# Patient Record
Sex: Male | Born: 1965 | Race: White | Hispanic: No | Marital: Married
Health system: Midwestern US, Community
[De-identification: ages and names within clinical notes are randomized; demographics above are authoritative.]

## PROBLEM LIST (undated history)

## (undated) DIAGNOSIS — M79671 Pain in right foot: Secondary | ICD-10-CM

## (undated) DIAGNOSIS — Z8616 Personal history of COVID-19: Secondary | ICD-10-CM

## (undated) DIAGNOSIS — K219 Gastro-esophageal reflux disease without esophagitis: Secondary | ICD-10-CM

## (undated) DIAGNOSIS — J300 Vasomotor rhinitis: Secondary | ICD-10-CM

## (undated) DIAGNOSIS — K589 Irritable bowel syndrome without diarrhea: Secondary | ICD-10-CM

## (undated) DIAGNOSIS — E782 Mixed hyperlipidemia: Secondary | ICD-10-CM

## (undated) DIAGNOSIS — M4310 Spondylolisthesis, site unspecified: Secondary | ICD-10-CM

## (undated) DIAGNOSIS — J45998 Other asthma: Secondary | ICD-10-CM

## (undated) DIAGNOSIS — M25521 Pain in right elbow: Secondary | ICD-10-CM

## (undated) DIAGNOSIS — M25522 Pain in left elbow: Secondary | ICD-10-CM

## (undated) DIAGNOSIS — H903 Sensorineural hearing loss, bilateral: Secondary | ICD-10-CM

## (undated) HISTORY — DX: Other asthma: J45.998

## (undated) HISTORY — DX: Pain in left elbow: M25.522

## (undated) HISTORY — DX: Gastro-esophageal reflux disease without esophagitis: K21.9

## (undated) HISTORY — DX: Pain in right foot: M79.671

## (undated) HISTORY — DX: Irritable bowel syndrome without diarrhea: K58.9

## (undated) HISTORY — DX: Personal history of COVID-19: Z86.16

## (undated) HISTORY — DX: Spondylolisthesis, site unspecified: M43.10

## (undated) HISTORY — DX: Sensorineural hearing loss, bilateral: H90.3

## (undated) HISTORY — DX: Mixed hyperlipidemia: E78.2

## (undated) HISTORY — DX: Vasomotor rhinitis: J30.0

## (undated) HISTORY — DX: Pain in right elbow: M25.521

---

## 2008-09-06 ENCOUNTER — Emergency Department (HOSPITAL_COMMUNITY): Admission: EM | Admit: 2008-09-06 | Discharge: 2008-09-07 | Payer: Self-pay | Admitting: Emergency Medicine

## 2008-11-08 ENCOUNTER — Emergency Department (HOSPITAL_COMMUNITY): Admission: EM | Admit: 2008-11-08 | Discharge: 2008-11-08 | Payer: Self-pay | Admitting: Emergency Medicine

## 2008-12-20 HISTORY — PX: ESOPHAGOGASTRODUODENOSCOPY: SHX1529

## 2009-11-15 ENCOUNTER — Ambulatory Visit: Payer: Self-pay | Admitting: Professional

## 2009-11-23 ENCOUNTER — Ambulatory Visit: Payer: Self-pay | Admitting: Professional

## 2012-03-21 ENCOUNTER — Other Ambulatory Visit: Payer: Self-pay | Admitting: Gastroenterology

## 2012-03-21 DIAGNOSIS — R1013 Epigastric pain: Secondary | ICD-10-CM

## 2012-03-22 HISTORY — PX: OTHER SURGICAL HISTORY: SHX169

## 2012-04-10 ENCOUNTER — Ambulatory Visit: Payer: Self-pay

## 2012-04-12 ENCOUNTER — Ambulatory Visit
Admission: RE | Admit: 2012-04-12 | Discharge: 2012-04-12 | Disposition: A | Discharge status: Home / Self Care | Payer: BC Managed Care – PPO | Source: Ambulatory Visit | Attending: Gastroenterology | Admitting: Gastroenterology

## 2012-04-12 DIAGNOSIS — R1013 Epigastric pain: Secondary | ICD-10-CM

## 2014-02-26 IMAGING — US US ABDOMEN COMPLETE
1 series · 14 of 25 positions shown · non-contrast
Comparison: None.

CLINICAL DATA: Epigastric pain for several weeks

COMPLETE ABDOMINAL ULTRASOUND

[Series 1: us abdomen complete · 0.24mm/px · 14 of 70 slices shown]
[im 1/70]
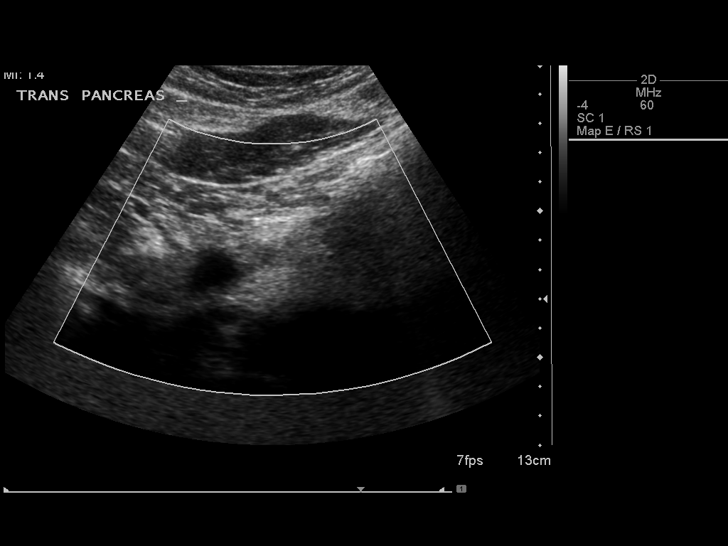
[im 6/70]
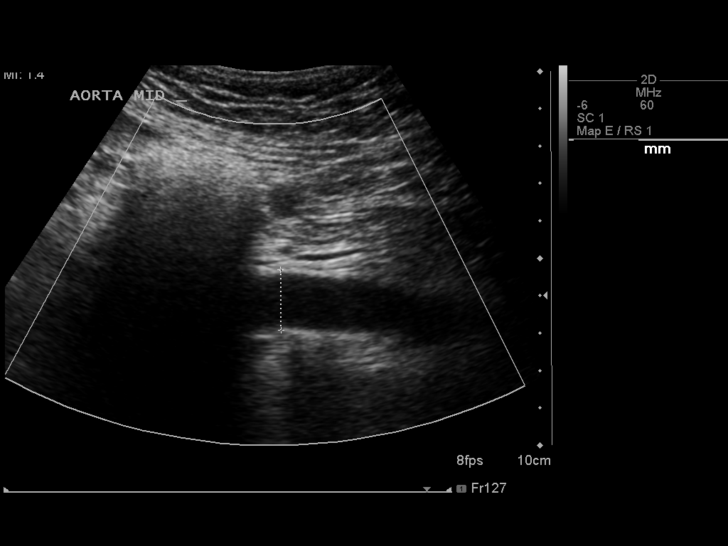
[im 12/70]
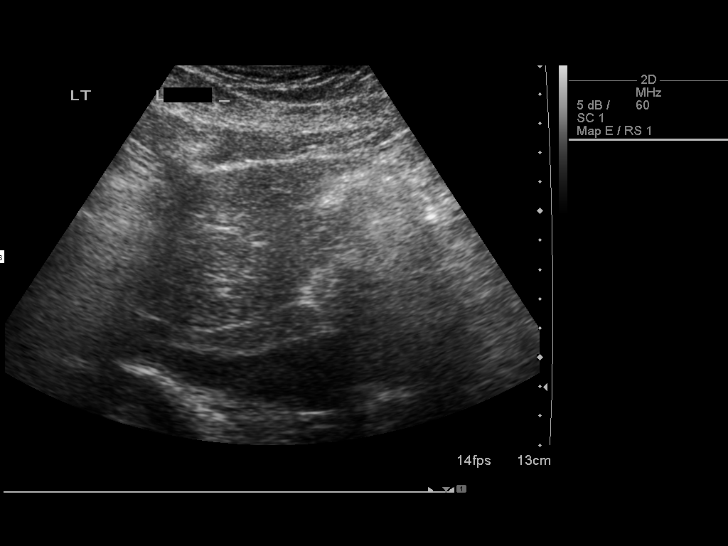
[im 18/70]
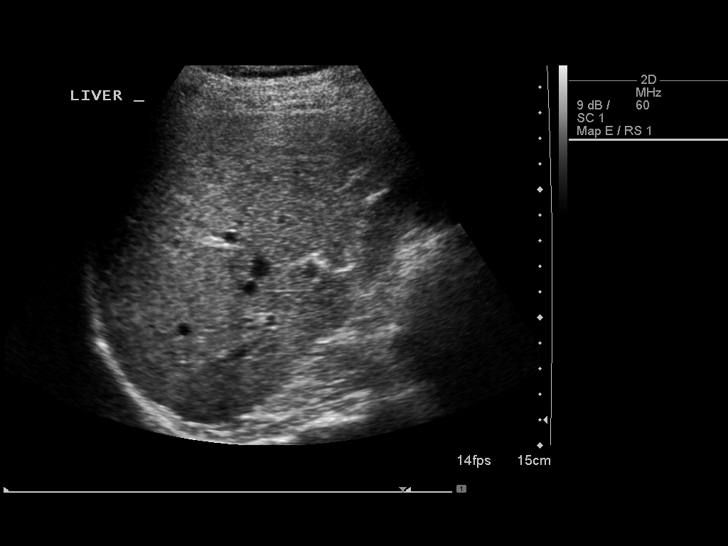
[im 24/70]
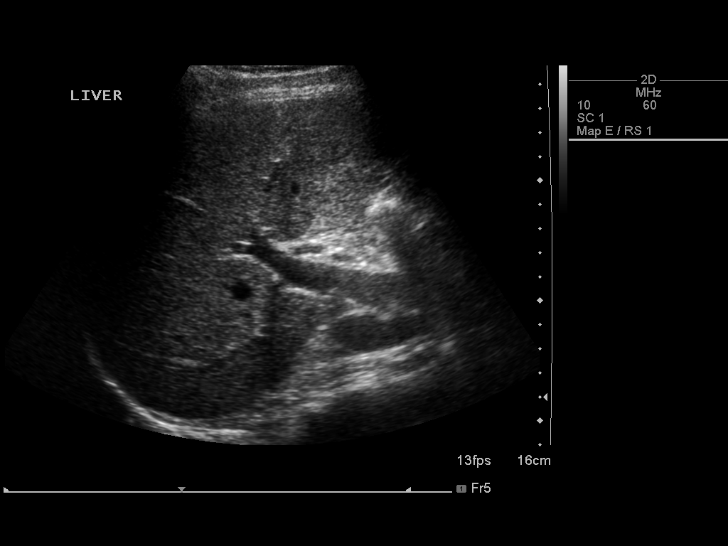
[im 26/70]
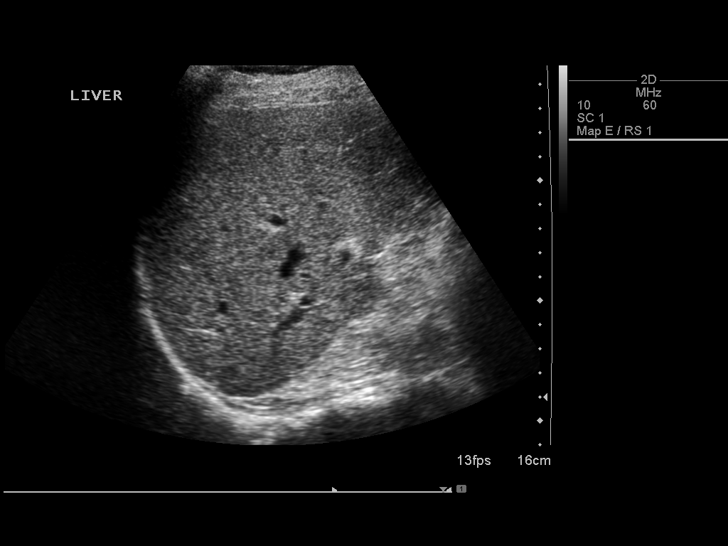
[im 32/70]
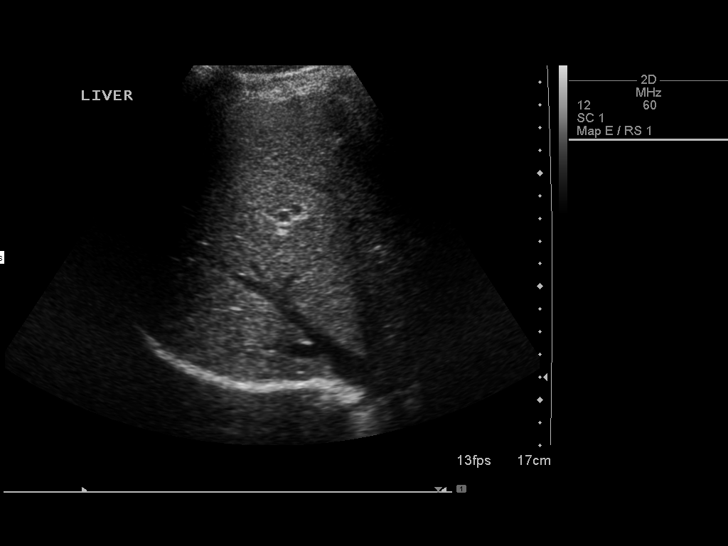
[im 38/70]
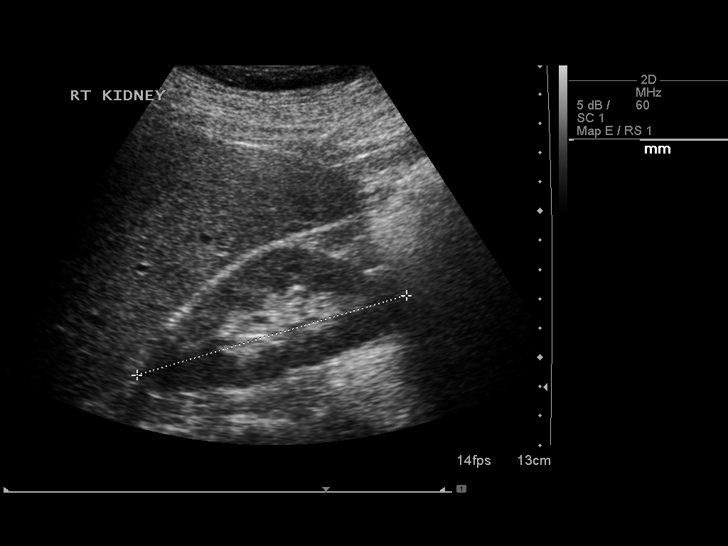
[im 44/70]
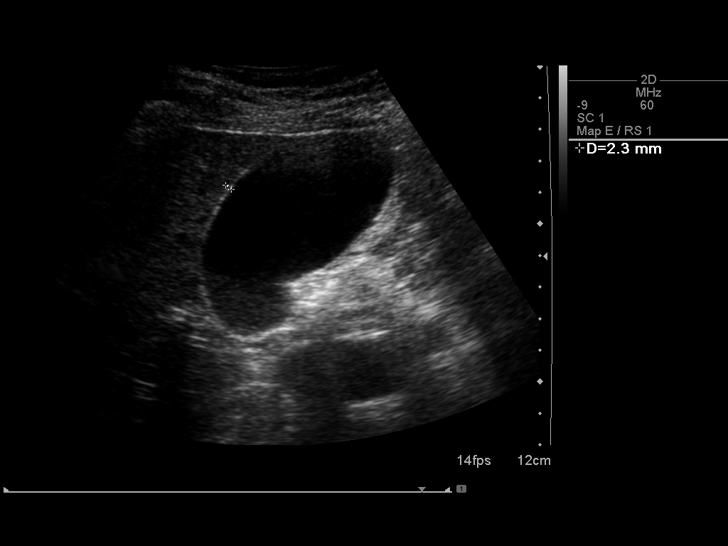
[im 47/70]
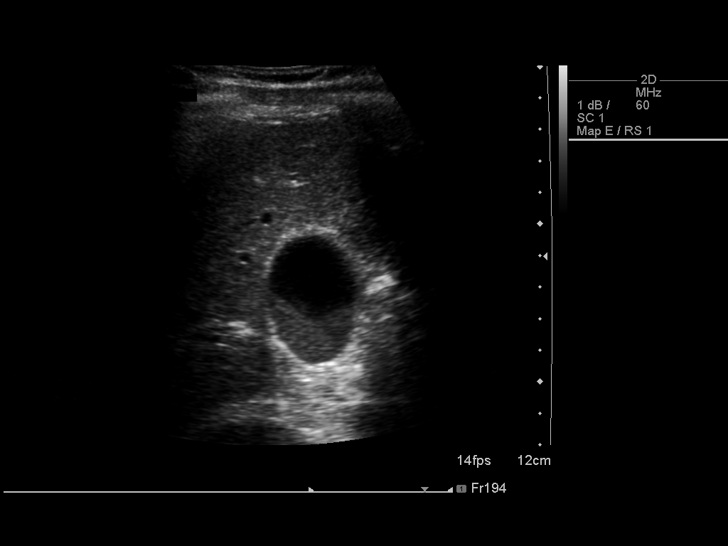
[im 52/70]
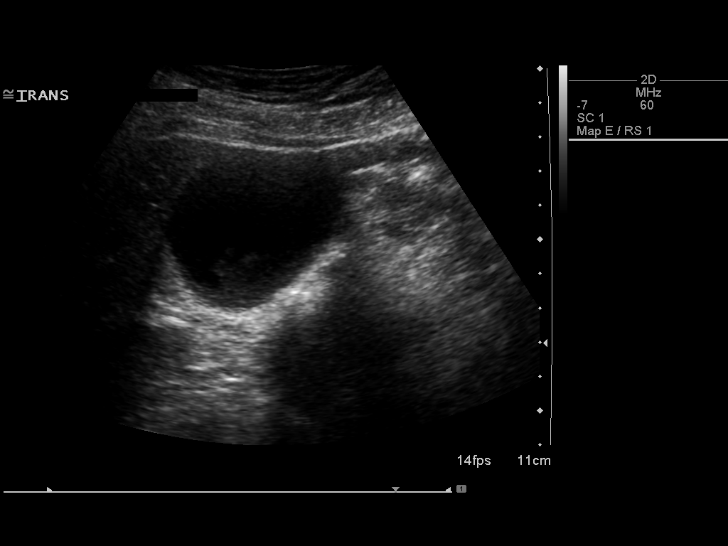
[im 58/70]
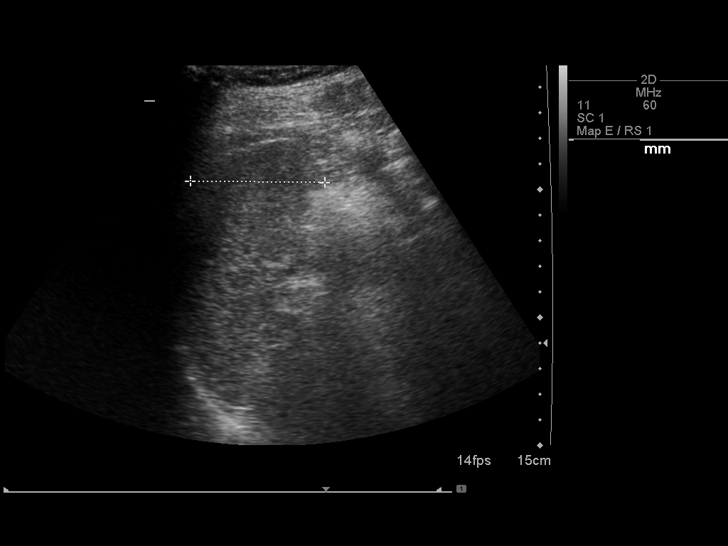
[im 64/70]
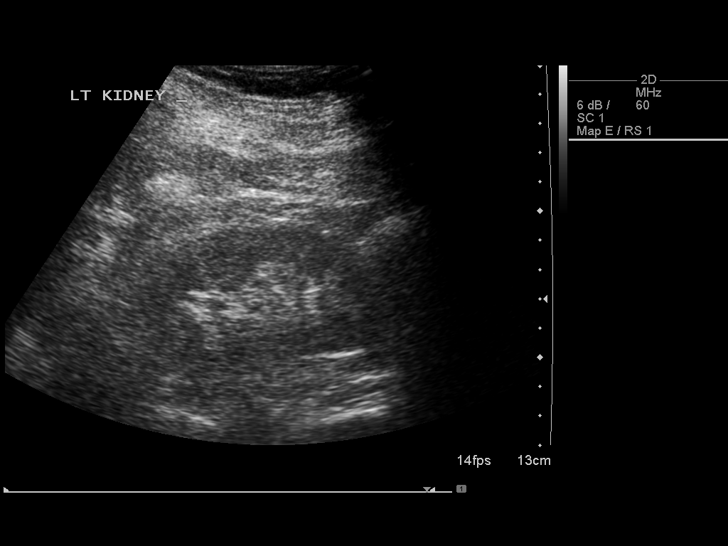
[im 70/70]
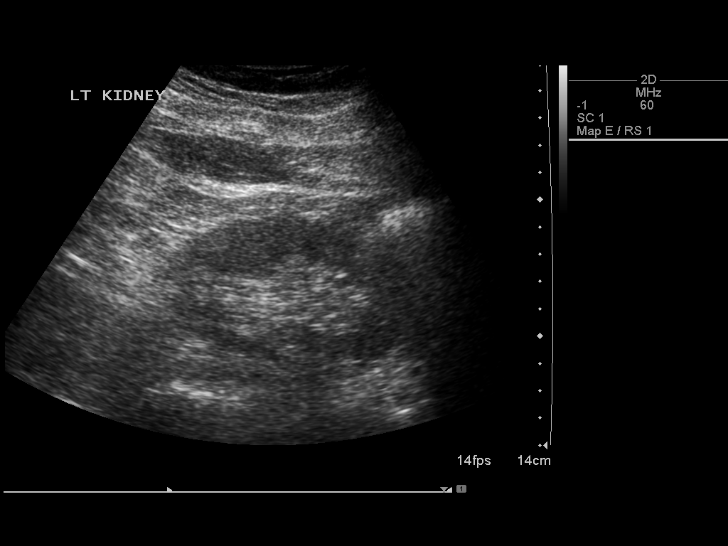

[14 of 25 positions shown; findings below may reference images not displayed]

FINDINGS: Gallbladder:  The gallbladder is well visualized and there is
minimal sludge present.  No gallstones are seen and there is no
pain over the gallbladder with compression.

Common bile duct:  The common bile duct is normal measuring 4.0 mm
in diameter.

Liver:  The liver has a normal echogenic pattern.  No ductal
dilatation is seen.

IVC:  Appears normal.

Pancreas:  The pancreas is unremarkable although the head and tail
partially obscured by bowel gas.

Spleen:  The spleen is normal measuring 5.3 cm sagittally.

Right Kidney:  No hydronephrosis is seen.  The right kidney
measures 9.6 cm sagittally.

Left Kidney:  No hydronephrosis is noted.  The left kidney measures
9.5 cm and is partially obscured by bowel gas.

Abdominal aorta:  The abdominal aorta is normal in caliber.
IMPRESSION: 1.  Gallbladder sludge.  No gallstones.  No pain over the
gallbladder.
2.  The head and tail of the pancreas are obscured by bowel gas.
3.  No hydronephrosis.

## 2014-05-22 DIAGNOSIS — K589 Irritable bowel syndrome without diarrhea: Secondary | ICD-10-CM

## 2014-05-22 HISTORY — DX: Irritable bowel syndrome, unspecified: K58.9

## 2014-07-17 ENCOUNTER — Ambulatory Visit (INDEPENDENT_AMBULATORY_CARE_PROVIDER_SITE_OTHER): Payer: BLUE CROSS/BLUE SHIELD | Admitting: Family Medicine

## 2014-07-17 ENCOUNTER — Encounter: Payer: Self-pay | Admitting: Family Medicine

## 2014-07-17 VITALS — BP 102/80 | HR 70 | Temp 98.2°F | Ht 69.0 in | Wt 169.0 lb

## 2014-07-17 DIAGNOSIS — R109 Unspecified abdominal pain: Secondary | ICD-10-CM

## 2014-07-17 DIAGNOSIS — K589 Irritable bowel syndrome without diarrhea: Secondary | ICD-10-CM

## 2014-07-17 MED ORDER — HYOSCYAMINE SULFATE 0.125 MG SL SUBL: SUBLINGUAL_TABLET | SUBLINGUAL | Status: DC

## 2014-07-17 NOTE — Progress Notes (Signed)
Office Note 07/17/2014  CC:  Chief Complaint  Patient presents with  . Establish Care    HPI:  Brian Osborne is a 49 y.o. White male who is here to establish care and discuss stomach issues. Patient's most recent primary MD: [facility]. Old records in EPIC/HL were reviewed prior to or during today's visit.  Hx of GERD and was on dexilant in the past but not for a few years. Endoscopy in the past: was told all normal.  Abd u/s has been done in past as well and he was told this was normal.  He adjusted his diet and stomach issues got better.  Describes few years hx of "unsettled" feeling in stomach intermittently, feels abd pain and urgent need to have loose stool --but 4-5 hours after eating certain meals.  This most commonly happens after very greasy food (like a hamburger), [place] food like pasta when he eats too much, reheated pizza, reheated calzone. No prob with dairy products.  The abd pain is right over the umbillicus.  Rarely vomits or has any dry heaves with this.  Metamucil has been tried and he feels like this helped. NO black or tarry stool or hematochezia.  Other times he has no pain but tendency towards frequent loose stools.   Happens once every 2 wks or so.   Denies any recent prob with actual substernal burning or typical reflux.     Past Medical History  Diagnosis Date  . GERD (gastroesophageal reflux disease)   . Vasomotor rhinitis     History reviewed. No pertinent past surgical history.  History reviewed. No pertinent family history.  History   Social History  . Marital Status: Divorced    Spouse Name: N/A  . Number of Children: N/A  . Years of Education: N/A   Occupational History  . Not on file.   Social History Main Topics  . Smoking status: Former Research scientist (life sciences)  . Smokeless tobacco: Never Used  . Alcohol Use: No  . Drug Use: No  . Sexual Activity: Not on file   Other Topics Concern  . Not on file   Social History Narrative    Married, 3 children all adults from, from first marriage.   Adopted, so FH unknown.   Educ: HS   Occup: Forensic scientist   No signif cigarette use, but cigars up until 2011.   No alcohol or drugs.   No exercise.    Outpatient Encounter Prescriptions as of 07/17/2014  Medication Sig  . azelastine (ASTELIN) 0.1 % nasal spray as needed.  . hyoscyamine (LEVSIN SL) 0.125 MG SL tablet 1-2 tabs po q4h prn    Allergies  Allergen Reactions  . Amoxicillin Other (See Comments)    Dizziness    ROS Review of Systems  Constitutional: Negative for fever, chills, appetite change and fatigue.  HENT: Negative for congestion, dental problem, ear pain and sore throat.   Eyes: Negative for discharge, redness and visual disturbance.  Respiratory: Negative for cough, chest tightness, shortness of breath and wheezing.   Cardiovascular: Negative for chest pain, palpitations and leg swelling.  Gastrointestinal: Negative for vomiting, blood in stool and abdominal distention.  Genitourinary: Negative for dysuria, urgency, frequency, hematuria, flank pain and difficulty urinating.  Musculoskeletal: Negative for myalgias, back pain, joint swelling, arthralgias and neck stiffness.  Skin: Negative for pallor and rash.  Neurological: Negative for dizziness, speech difficulty, weakness and headaches.  Hematological: Negative for adenopathy. Does not bruise/bleed easily.  Psychiatric/Behavioral: Negative for  confusion and sleep disturbance. The patient is not nervous/anxious.     PE; Blood pressure 102/80, pulse 70, temperature 98.2 F (36.8 C), temperature source Oral, height _0  (1.753 m), weight 169 lb (76.658 kg), SpO2 96 %. Gen: Alert, well appearing.  Patient is oriented to person, place, time, and situation. OXB:DZHG: no injection, icteris, swelling, or exudate.  EOMI, PERRLA. Mouth: lips without lesion/swelling.  Oral mucosa pink and moist. Oropharynx without erythema, exudate, or swelling.   Neck - No masses or thyromegaly or limitation in range of motion CV: RRR, no m/r/g.   LUNGS: CTA bilat, nonlabored resps, good aeration in all lung fields. ABD: soft, NT, ND, BS normal.  No hepatospenomegaly or mass.  No bruits. EXT: no clubbing, cyanosis, or edema.   Pertinent labs:  None today   ASSESSMENT AND PLAN:   New pt; obtain old PCP and GI records.  1) IBS, predominately diarrhea-type.  This is not connected to foods in a way to suggest malabsorption or anatomic issue, nor does this sound like a gastritis/gerd issue.  Trial of levsin 0.125, 1-2 tabs q4h prn. Discussed f/u options with pt and he chose to not schedule f/u o/v but will call in 1 mo or so if not improved, earlier if worse or new problems arise.  If not improved with this trial of med, would check celiac panel, CBC, ESR, CMET.  An After Visit Summary was printed and given to the patient.  Return if symptoms worsen or fail to improve.

## 2014-07-17 NOTE — Progress Notes (Signed)
Pre visit review using our clinic review tool, if applicable. No additional management support is needed unless otherwise documented below in the visit note. 

## 2014-08-02 ENCOUNTER — Encounter: Payer: Self-pay | Admitting: Family Medicine

## 2014-08-03 ENCOUNTER — Encounter: Payer: Self-pay | Admitting: Family Medicine

## 2014-08-03 ENCOUNTER — Ambulatory Visit (INDEPENDENT_AMBULATORY_CARE_PROVIDER_SITE_OTHER): Payer: BLUE CROSS/BLUE SHIELD | Admitting: Family Medicine

## 2014-08-03 VITALS — BP 119/86 | HR 72 | Temp 98.6°F | Resp 18 | Ht 69.0 in | Wt 167.0 lb

## 2014-08-03 DIAGNOSIS — K589 Irritable bowel syndrome without diarrhea: Secondary | ICD-10-CM

## 2014-08-03 LAB — CBC WITH DIFFERENTIAL/PLATELET
Basophils Absolute: 0 10*3/uL (ref 0.0–0.1)
Basophils Relative: 0.5 % (ref 0.0–3.0)
Eosinophils Absolute: 0.1 10*3/uL (ref 0.0–0.7)
Eosinophils Relative: 2.1 % (ref 0.0–5.0)
HCT: 48.4 % (ref 39.0–52.0)
Hemoglobin: 16.6 g/dL (ref 13.0–17.0)
Lymphocytes Relative: 22.5 % (ref 12.0–46.0)
Lymphs Abs: 1.5 10*3/uL (ref 0.7–4.0)
MCHC: 34.3 g/dL (ref 30.0–36.0)
MCV: 88.4 fl (ref 78.0–100.0)
Monocytes Absolute: 0.4 10*3/uL (ref 0.1–1.0)
Monocytes Relative: 6.1 % (ref 3.0–12.0)
Neutro Abs: 4.5 10*3/uL (ref 1.4–7.7)
Neutrophils Relative %: 68.8 % (ref 43.0–77.0)
Platelets: 214 10*3/uL (ref 150.0–400.0)
RBC: 5.47 Mil/uL (ref 4.22–5.81)
RDW: 13.3 % (ref 11.5–15.5)
WBC: 6.6 10*3/uL (ref 4.0–10.5)

## 2014-08-03 LAB — COMPREHENSIVE METABOLIC PANEL
ALT: 14 U/L (ref 0–53)
AST: 18 U/L (ref 0–37)
Albumin: 4.3 g/dL (ref 3.5–5.2)
Alkaline Phosphatase: 70 U/L (ref 39–117)
BUN: 14 mg/dL (ref 6–23)
CALCIUM: 9.6 mg/dL (ref 8.4–10.5)
CHLORIDE: 103 meq/L (ref 96–112)
CO2: 33 mEq/L — ABNORMAL HIGH (ref 19–32)
Creatinine, Ser: 0.87 mg/dL (ref 0.40–1.50)
GFR: 98.82 mL/min (ref >60.00)
Glucose, Bld: 98 mg/dL (ref 70–99)
POTASSIUM: 4.3 meq/L (ref 3.5–5.1)
SODIUM: 138 meq/L (ref 135–145)
TOTAL PROTEIN: 7 g/dL (ref 6.0–8.3)
Total Bilirubin: 1.1 mg/dL (ref 0.2–1.2)

## 2014-08-03 LAB — SEDIMENTATION RATE: Sed Rate: 10 mm/hr (ref 0–22)

## 2014-08-03 LAB — IGA: IgA: 394 mg/dL — ABNORMAL HIGH (ref 68–378)

## 2014-08-03 NOTE — Progress Notes (Signed)
Pre visit review using our clinic review tool, if applicable. No additional management support is needed unless otherwise documented below in the visit note. 

## 2014-08-03 NOTE — Progress Notes (Signed)
OFFICE NOTE  08/03/2014  CC:  Chief Complaint  Patient presents with  . Diarrhea    last GI was Dr. Carol Ada @ [facility]   HPI: Patient is a 49 y.o. Caucasian male who is here for 2 week f/u IBS symptoms. I started him on trial of prn levsin last visit.   Has had some typical "nervous stomach" followed by loose stools on a couple of occasions since I last saw him, sometimes also related to food ingestion.  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 It sounds like it may have helped once but most recent use of the med was after already having 2 days of significant sx's (took pepto during this time).  Yesterday was most recent day of loose BMs, still with nervous stomach today but no loose BMs today.  He hasn't tried eating anything today.  No fever, no rash, no joint pains, no blood in stool.  Pertinent PMH:  Past medical, surgical, social, and family history reviewed and no changes are noted since last office visit.  MEDS:  Outpatient Prescriptions Prior to Visit  Medication Sig Dispense Refill  . azelastine (ASTELIN) 0.1 % nasal spray as needed.  11  . hyoscyamine (LEVSIN SL) 0.125 MG SL tablet 1-2 tabs po q4h prn 30 tablet 3   No facility-administered medications prior to visit.    PE: Blood pressure 119/86, pulse 72, temperature 98.6 F (37 C), temperature source Temporal, resp. rate 18, height _0  (1.753 m), weight 167 lb (75.751 kg), SpO2 95 %. Gen: Alert, well appearing.  Patient is oriented to person, place, time, and situation. ABD: soft, NT/ND, BS slightly hypoactive.  IMPRESSION AND PLAN:  IBS, pt not using levsin b/c of fear of new med.  We discussed this more today and I think he will start taking it correctly now.  Also, I'll do some basic labs (CBC, CMET, ESR, TTG IgA, IgA total) and will get him back in to see his prior GI MD, Dr. Benson Norway.  He  also requests referral to nutritionist, which I ordered today.  An After Visit Summary was printed and given to the patient.  FOLLOW UP: 1 mo recheck IBS

## 2014-08-04 LAB — TISSUE TRANSGLUTAMINASE, IGA: TISSUE TRANSGLUTAMINASE AB, IGA: 1 U/mL (ref <4)

## 2014-08-15 ENCOUNTER — Encounter: Payer: Self-pay | Admitting: Family Medicine

## 2014-08-24 ENCOUNTER — Encounter: Payer: Self-pay | Admitting: Family Medicine

## 2014-08-31 ENCOUNTER — Encounter: Payer: Self-pay | Admitting: Family Medicine

## 2014-08-31 ENCOUNTER — Ambulatory Visit (INDEPENDENT_AMBULATORY_CARE_PROVIDER_SITE_OTHER): Payer: BLUE CROSS/BLUE SHIELD | Admitting: Family Medicine

## 2014-08-31 VITALS — BP 104/70 | HR 62 | Temp 98.3°F | Ht 69.0 in | Wt 167.0 lb

## 2014-08-31 DIAGNOSIS — J310 Chronic rhinitis: Secondary | ICD-10-CM | POA: Insufficient documentation

## 2014-08-31 DIAGNOSIS — K589 Irritable bowel syndrome without diarrhea: Secondary | ICD-10-CM

## 2014-08-31 MED ORDER — HYOSCYAMINE SULFATE 0.125 MG SL SUBL: SUBLINGUAL_TABLET | SUBLINGUAL | Status: DC

## 2014-08-31 MED ORDER — AZELASTINE HCL 0.1 % NA SOLN: 2.0000 | Freq: Two times a day (BID) | NASAL | Status: DC

## 2014-08-31 NOTE — Progress Notes (Signed)
OFFICE NOTE  08/31/2014  CC:  Chief Complaint  Patient presents with  . Follow-up    4 weeks   HPI: Patient is a 49 y.o. Caucasian male who is here for 1 mo f/u IBS-diarrhea. Saw Dr. Elnoria HowardHung, his GI MD, since I last saw him. Taking levsin bid and doing very well (95% of the time).  He has started adjusting diet some already but has not seen nutritionist yet--this is also helping a lot.  Asks for RFs of his azelastine nasal spray, which is helping a lot with his night time nasal congestion due to vasomotor rhinitis vs allergic rhinitis.  Pertinent PMH:  Past medical, surgical, social, and family history reviewed and no changes are noted since last office visit.  MEDS:  Outpatient Prescriptions Prior to Visit  Medication Sig Dispense Refill  . azelastine (ASTELIN) 0.1 % nasal spray as needed.  11  . bismuth subsalicylate (PEPTO BISMOL) 262 MG chewable tablet Chew 524 mg by mouth as needed.    . hyoscyamine (LEVSIN SL) 0.125 MG SL tablet 1-2 tabs po q4h prn 30 tablet 3   No facility-administered medications prior to visit.    PE: Blood pressure 104/70, pulse 62, temperature 98.3 F (36.8 C), temperature source Oral, height 5\' 9"  (1.753 m), weight 167 lb (75.751 kg), SpO2 97 %. Gen: Alert, well appearing.  Patient is oriented to person, place, time, and situation. AFFECT: pleasant, lucid thought and speech. No further exam today  IMPRESSION AND PLAN:  1) IBS, diarrhea predominant: doing well now on levsin 1 tab bid. He has approp f/u with Dr. Elnoria HowardHung, plans on getting his first screening colonoscopy with him in the near future. Re-reviewed all blood work with pt today: normal, including celiac screening.  2) Vasomotor vs allergic rhinitis: The current medical regimen is effective;  continue present plan and medications. RF'd azelastine nasal spray today.  An After Visit Summary was printed and given to the patient.  FOLLOW UP: 1 yr for fasting CPE

## 2014-08-31 NOTE — Progress Notes (Signed)
Pre visit review using our clinic review tool, if applicable. No additional management support is needed unless otherwise documented below in the visit note. 

## 2014-09-04 ENCOUNTER — Ambulatory Visit: Payer: BLUE CROSS/BLUE SHIELD | Admitting: Dietician

## 2014-09-18 ENCOUNTER — Ambulatory Visit: Payer: BLUE CROSS/BLUE SHIELD | Admitting: Dietician

## 2014-10-09 ENCOUNTER — Ambulatory Visit: Payer: BLUE CROSS/BLUE SHIELD | Admitting: Dietician

## 2015-03-02 HISTORY — PX: COLONOSCOPY W/ POLYPECTOMY: SHX1380

## 2015-03-04 ENCOUNTER — Encounter: Payer: Self-pay | Admitting: Family Medicine

## 2015-03-06 ENCOUNTER — Encounter: Payer: Self-pay | Admitting: Family Medicine

## 2015-03-18 ENCOUNTER — Encounter: Payer: Self-pay | Admitting: Family Medicine

## 2015-07-02 ENCOUNTER — Telehealth: Payer: Self-pay | Admitting: Family Medicine

## 2015-07-02 DIAGNOSIS — H9193 Unspecified hearing loss, bilateral: Secondary | ICD-10-CM

## 2015-07-02 NOTE — Telephone Encounter (Signed)
OK, but I need to know the reason the patient wants to see the ear doctor (? Ear pain, decreased hearing?, or whatever)-thx

## 2015-07-02 NOTE — Telephone Encounter (Signed)
Please advise. Thanks.  

## 2015-07-02 NOTE — Telephone Encounter (Signed)
Patient would like a referral to an ear doctor 

## 2015-07-05 NOTE — Telephone Encounter (Signed)
Spoke to pt he stated that a year ago he was seen by an ENT in [place] and was told her has hearing loss. He is wanting to see another ENT for hearing loss. 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 Please advise. Thanks.

## 2015-07-05 NOTE — Telephone Encounter (Signed)
Referral ordered to [facility] ENT audiology.

## 2015-07-05 NOTE — Telephone Encounter (Signed)
Pt advised and voiced understanding.  Pt requested to be emailed about this referral. email is [email or web address]

## 2015-07-06 NOTE — Telephone Encounter (Signed)
Patient email sent today

## 2015-08-26 ENCOUNTER — Ambulatory Visit (INDEPENDENT_AMBULATORY_CARE_PROVIDER_SITE_OTHER): Payer: BLUE CROSS/BLUE SHIELD | Admitting: Family Medicine

## 2015-08-26 ENCOUNTER — Encounter: Payer: Self-pay | Admitting: Family Medicine

## 2015-08-26 VITALS — BP 127/89 | HR 73 | Temp 98.1°F | Resp 16 | Ht 69.0 in | Wt 178.0 lb

## 2015-08-26 DIAGNOSIS — F411 Generalized anxiety disorder: Secondary | ICD-10-CM | POA: Diagnosis not present

## 2015-08-26 DIAGNOSIS — F32A Depression, unspecified: Secondary | ICD-10-CM

## 2015-08-26 DIAGNOSIS — F329 Major depressive disorder, single episode, unspecified: Secondary | ICD-10-CM

## 2015-08-26 DIAGNOSIS — F430 Acute stress reaction: Secondary | ICD-10-CM

## 2015-08-26 NOTE — Progress Notes (Signed)
Pre visit review using our clinic review tool, if applicable. No additional management support is needed unless otherwise documented below in the visit note. 

## 2015-08-26 NOTE — Progress Notes (Signed)
OFFICE VISIT  08/26/2015   CC:  Chief Complaint  Patient presents with  . Hospitalization Follow-up   HPI:    Patient is a 50 y.o. Caucasian male who presents for hospital ER follow up from 08/24/15.  This was in [facility] [place]: I reviewed the entire record of his ED visit today.  He is a IT trainertrucker, had pulled over to take a nap.  After napping, he got out of truck and felt knee pop.  Felt nauseated, lightheaded, presyncopal.  Got back in truck and felt panicky/hyperventilated.  In the ED, an EKG was normal and so was a noncontrast head CT.  Also, a CXR was normal.  CBC normal, CMET was essentially normal as well. He may have had a vasovagal response to twisting his knee or had a panic attack..    He breaks down today and says he feels totally stressed out--has felt this way for months.  Has hx of generalized anxiety, but it is all worse lately, also feeling down/sad/anhedonia.  Work stress, uncertainty about some things there.  He is stressed out by listening to too much politics--"there is way too much hate and negativity in the world.  Having more tension HA's lately.   He is very scared that he feels this way: depressed.   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  He asks about clonazepam but then said he really doesn't want to take this med. In the past when this has happened to him: approx 3 yrs ago---he saw a therapist and started exercising more and took 3 mo out of work to "get myself together".    Past Medical History  Diagnosis Date  . GERD (gastroesophageal reflux disease)     Normal Bravo pH probe study 2010  . Vasomotor rhinitis   . IBS (irritable bowel syndrome) 2016    w/diarrhea (celiac blood work neg)    Past Surgical History  Procedure Laterality Date  . Esophagogastroduodenoscopy  12/2008    Mild duodenitis, biopsies neg. (Dr. Elnoria HowardHung)  . Abd ultrasound  03/2012    Normal  . Colonoscopy w/ polypectomy   03/02/15    benign lymphoid polyp: recall 10 yrs (Dr. Elnoria HowardHung)    Outpatient Prescriptions Prior to Visit  Medication Sig Dispense Refill  . azelastine (ASTELIN) 0.1 % nasal spray Place 2 sprays into both nostrils 2 (two) times daily. 30 mL 12  . bismuth subsalicylate (PEPTO BISMOL) 262 MG chewable tablet Chew 524 mg by mouth as needed.    . hyoscyamine (LEVSIN SL) 0.125 MG SL tablet 1-2 tabs po q4h prn (Patient not taking: Reported on 08/26/2015) 60 tablet 12   No facility-administered medications prior to visit.    Allergies  Allergen Reactions  . Amoxicillin Other (See Comments)    Dizziness    ROS As per HPI  PE: Blood pressure 127/89, pulse 73, temperature 98.1 F (36.7 C), temperature source Oral, resp. rate 16, height 5\' 9"  (1.753 m), weight 178 lb (80.74 kg), SpO2 96 %. Gen: Alert, well appearing.  Patient is oriented to person, place, time, and situation. ZOX:WRUEENT:Eyes: no injection, icteris, swelling, or exudate.  EOMI, PERRLA. Mouth: lips without lesion/swelling.  Oral mucosa pink and moist. Oropharynx without erythema, exudate, or swelling.  Neck - No masses or thyromegaly or limitation in range of motion CV: RRR, no m/r/g.   LUNGS: CTA bilat, nonlabored resps, good aeration in all lung fields. EXT: no clubbing, cyanosis,  or edema.    LABS:  None today  IMPRESSION AND PLAN:  1) Vasovagal response vs panic attack: prompted recent ED visit.  All was normal in w/u. Reassured pt again today.  2) GAD with recent acute stress reaction.  Also with depression at this time. Discussed treatment options and he is against meds at the current time.  He is agreeable to seeing a therapist so I gave him contact info for Ollen Gross, PhD.  Encouraged pt to start exercising, stop listening to politics on radio/TV. Wrote note releasing him to return to work today. Encouraged pt to get established in a church of his choice so he can get this support as well.  An After Visit Summary was  printed and given to the patient.  Spent 35 min with pt today, with >50% of this time spent in counseling and care coordination regarding the above problems.  FOLLOW UP: Return for f/u 4-6 wks anx/dep.  Signed:  Santiago Bumpers, MD           08/26/2015

## 2015-08-26 NOTE — Patient Instructions (Signed)
Ellen Wilson, PhD [phone] [id number] in [place] (office of Dr. Parrish McKinney).  

## 2015-09-28 ENCOUNTER — Other Ambulatory Visit: Payer: Self-pay

## 2015-09-28 MED ORDER — AZELASTINE HCL 0.1 % NA SOLN: 2.0000 | Freq: Two times a day (BID) | NASAL | Status: DC

## 2016-02-18 ENCOUNTER — Ambulatory Visit (INDEPENDENT_AMBULATORY_CARE_PROVIDER_SITE_OTHER): Payer: BLUE CROSS/BLUE SHIELD | Admitting: Family Medicine

## 2016-02-18 ENCOUNTER — Encounter: Payer: Self-pay | Admitting: Family Medicine

## 2016-02-18 VITALS — BP 134/92 | HR 107 | Temp 98.1°F | Resp 16 | Wt 178.4 lb

## 2016-02-18 DIAGNOSIS — J4521 Mild intermittent asthma with (acute) exacerbation: Secondary | ICD-10-CM

## 2016-02-18 DIAGNOSIS — J302 Other seasonal allergic rhinitis: Secondary | ICD-10-CM

## 2016-02-18 DIAGNOSIS — J069 Acute upper respiratory infection, unspecified: Secondary | ICD-10-CM

## 2016-02-18 MED ORDER — ALBUTEROL SULFATE HFA 108 (90 BASE) MCG/ACT IN AERS: 1.0000 | INHALATION_SPRAY | RESPIRATORY_TRACT | 1 refills | Status: DC | PRN

## 2016-02-18 MED ORDER — PREDNISONE 20 MG PO TABS: ORAL_TABLET | ORAL | 0 refills | Status: DC

## 2016-02-18 NOTE — Patient Instructions (Signed)
Get otc generic robitussin DM OR Mucinex DM and use as directed on the packaging for cough and congestion. Use otc generic saline nasal spray 2-3 times per day to irrigate/moisturize your nasal passages.   

## 2016-02-18 NOTE — Progress Notes (Signed)
OFFICE VISIT  02/18/2016   CC:  Chief Complaint  Patient presents with  . Allergic Rhinitis     seasonal allergies   HPI:    Patient is a 50 y.o.  male who presents for "seasonal allergies". For about the last 5 days has been having lots of cough and SOB.  Feels mucous rattle but not coming all the way up. Says he gets like this each year September.  Taking astelin and allegra D.   Has been using an old albuterol inhaler and it helps some.   Says chronic eye and nose sx's of allergies are stable lately. No fever.  He does hear some wheeze "kinda".  Most recent albuterol MDI use was last night.   Past Medical History:  Diagnosis Date  . GERD (gastroesophageal reflux disease)    Normal Bravo pH probe study 2010  . IBS (irritable bowel syndrome) 2016   w/diarrhea (celiac blood work neg)  . Vasomotor rhinitis     Past Surgical History:  Procedure Laterality Date  . abd ultrasound  03/2012   Normal  . COLONOSCOPY W/ POLYPECTOMY  03/02/15   benign lymphoid polyp: recall 10 yrs (Dr. Elnoria HowardHung)  . ESOPHAGOGASTRODUODENOSCOPY  12/2008   Mild duodenitis, biopsies neg. (Dr. Elnoria HowardHung)    Outpatient Medications Prior to Visit  Medication Sig Dispense Refill  . azelastine (ASTELIN) 0.1 % nasal spray Place 2 sprays into both nostrils 2 (two) times daily. 30 mL 12  . bismuth subsalicylate (PEPTO BISMOL) 262 MG chewable tablet Chew 524 mg by mouth as needed.    . naproxen sodium (ANAPROX) 220 MG tablet Take 1 tablet by mouth 2 (two) times daily as needed.     No facility-administered medications prior to visit.     Allergies  Allergen Reactions  . Amoxicillin Other (See Comments)    Dizziness    ROS As per HPI  PE: Blood pressure (!) 134/92, pulse (!) 107, temperature 98.1 F (36.7 C), temperature source Oral, resp. rate 16, weight 178 lb 6.4 oz (80.9 kg), SpO2 96 %. VS: noted--normal. Gen: alert, NAD, well- APPEARING. HEENT: eyes without injection, drainage, or swelling.  Ears:  EACs clear, TMs with normal light reflex and landmarks.  Nose: Clear rhinorrhea, with some dried, crusty exudate adherent to mildly injected mucosa.  No purulent d/c.  No paranasal sinus TTP.  No facial swelling.  Throat and mouth without focal lesion.  No pharyngial swelling, erythema, or exudate.   Neck: supple, no LAD.   LUNGS: CTA bilat on inspiration, but he has mild diffuse exp wheezing with decent aeration, nonlabored resps.  Frequent mid or post exhalation coughing is noted. CV: RRR, no m/r/g. EXT: no c/c/e SKIN: no rash  LABS:  none  IMPRESSION AND PLAN:  His allergic rhinitis actually sounds stable. He has mild intermittent asthma, acute flare currently. Prednisone 40mg  qd x 5d. Ventolin HFA 1-2 p q4h prn.  Therapeutic expectations and side effect profile of medication discussed today.  Patient's questions answered. Get otc generic robitussin DM OR Mucinex DM and use as directed on the packaging for cough and congestion. Use otc generic saline nasal spray 2-3 times per day to irrigate/moisturize your nasal passages.  An After Visit Summary was printed and given to the patient.  FOLLOW UP: Return if symptoms worsen or fail to improve.  Signed:  Santiago BumpersPhil McGowen, MD           02/18/2016

## 2016-02-18 NOTE — Progress Notes (Signed)
Pre visit review using our clinic review tool, if applicable. No additional management support is needed unless otherwise documented below in the visit note. 

## 2016-06-14 ENCOUNTER — Encounter: Payer: Self-pay | Admitting: Podiatry

## 2016-06-14 ENCOUNTER — Ambulatory Visit (INDEPENDENT_AMBULATORY_CARE_PROVIDER_SITE_OTHER): Payer: BLUE CROSS/BLUE SHIELD

## 2016-06-14 ENCOUNTER — Ambulatory Visit (INDEPENDENT_AMBULATORY_CARE_PROVIDER_SITE_OTHER): Payer: BLUE CROSS/BLUE SHIELD | Admitting: Podiatry

## 2016-06-14 VITALS — BP 139/94 | HR 67 | Resp 16 | Ht 69.0 in | Wt 172.0 lb

## 2016-06-14 DIAGNOSIS — M7751 Other enthesopathy of right foot: Secondary | ICD-10-CM | POA: Diagnosis not present

## 2016-06-14 DIAGNOSIS — M79671 Pain in right foot: Secondary | ICD-10-CM | POA: Diagnosis not present

## 2016-06-14 DIAGNOSIS — M778 Other enthesopathies, not elsewhere classified: Secondary | ICD-10-CM

## 2016-06-14 DIAGNOSIS — M779 Enthesopathy, unspecified: Secondary | ICD-10-CM

## 2016-06-14 MED ORDER — TRIAMCINOLONE ACETONIDE 10 MG/ML IJ SUSP
10.0000 mg | Freq: Once | INTRAMUSCULAR | Status: AC
  Administered 2016-06-14: 10 mg

## 2016-06-14 NOTE — Progress Notes (Signed)
Subjective:     Patient ID: Brian Osborne, male   DOB: August 07, 1965, 51 y.o.   MRN: [id number]  HPI patient presents stating I have had pain in the bottom my right foot for around a month that's improving slightly but it's still feels inflamed. Patient states that he's been walking differently due to discomfort   Review of Systems  All other systems reviewed and are negative.      Objective:   Physical Exam  Constitutional: He is oriented to person, place, and time.  Cardiovascular: Intact distal pulses.   Musculoskeletal: Normal range of motion.  Neurological: He is oriented to person, place, and time.  Skin: Skin is warm.  Nursing note and vitals reviewed.  neurovascular status intact muscle strength adequate range of motion within normal limits with patient noted to have in the right foot quite a bit of discomfort around the tibial sesamoid complex with inflammation fluid buildup. It is localized in nature and it is tender when pressed but not extreme with fluid buildup around the sesamoidal complex tibial. 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 Today careful injection administered around the sesamoidal complex and explain the possibility for fracture and that ultimately the tibial sesamoid may need to be removed. 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

## 2016-06-14 NOTE — Progress Notes (Signed)
   Subjective:    Patient ID: Brian RuffJohn W Hires, male    DOB: [birth year], 51 y.o.   MRN: [id number]  HPI  Chief Complaint  Patient presents with  . Foot Pain    Right; Plantar Forefoot; Beneath Great Toe x 1 month.        Review of Systems     Objective:   Physical Exam        Assessment & Plan:

## 2016-06-20 ENCOUNTER — Encounter: Payer: Self-pay | Admitting: Family Medicine

## 2016-09-25 ENCOUNTER — Encounter: Payer: Self-pay | Admitting: Family Medicine

## 2016-09-25 ENCOUNTER — Ambulatory Visit (INDEPENDENT_AMBULATORY_CARE_PROVIDER_SITE_OTHER): Payer: BLUE CROSS/BLUE SHIELD | Admitting: Family Medicine

## 2016-09-25 VITALS — BP 101/64 | HR 80 | Temp 98.2°F | Resp 16 | Ht 69.0 in | Wt 168.2 lb

## 2016-09-25 DIAGNOSIS — R42 Dizziness and giddiness: Secondary | ICD-10-CM | POA: Diagnosis not present

## 2016-09-25 NOTE — Progress Notes (Signed)
OFFICE VISIT  09/25/2016   CC:  Chief Complaint  Patient presents with  . Dizziness    started this morning   HPI:    Patient is a 51 y.o.  male who presents for dizziness. Describes waking up, felt lightheadedness upon getting out of bed, felt like he was going to faint.  Layed down.  Tried to get up again and felt same thing happen.  He started to get signif anxiety.  Dizziness was postural and seemed to last a minute at most.  Went back to sleep a couple of hours and has been feeling good, no recurrence of symptoms since then.   No vertigo.   No palpitations.  This kind of thing happens to him every once in a while w/out any clear cause.  Checked bp last night due to HA: 126/82. Allergic rhinitis worse lately, feels sinus/head fullness.  He restarted his flonase in the last week and feels a bit better. Says he stopped soda in the last 10d, drinking more water and gatorade.   Pt does describe a hx orthostatic dizziness.  ROS: no fevers, no cough, no n/v/d, no heat intolerance, no rash.  Past Medical History:  Diagnosis Date  . GERD (gastroesophageal reflux disease)    Normal Bravo pH probe study 2010  . IBS (irritable bowel syndrome) 2016   w/diarrhea (celiac blood work neg)  . Right foot pain 05/2016   Inflammatory sesamoiditis (Dr. Charlsie Merlesegal)  . Vasomotor rhinitis     Past Surgical History:  Procedure Laterality Date  . abd ultrasound  03/2012   Normal  . COLONOSCOPY W/ POLYPECTOMY  03/02/15   benign lymphoid polyp: recall 10 yrs (Dr. Elnoria HowardHung)  . ESOPHAGOGASTRODUODENOSCOPY  12/2008   Mild duodenitis, biopsies neg. (Dr. Elnoria HowardHung)    Outpatient Medications Prior to Visit  Medication Sig Dispense Refill  . albuterol (VENTOLIN HFA) 108 (90 Base) MCG/ACT inhaler Inhale 1-2 puffs into the lungs every 4 (four) hours as needed for wheezing or shortness of breath. 18 g 1  . azelastine (ASTELIN) 0.1 % nasal spray Place 2 sprays into both nostrils 2 (two) times daily. 30 mL 12  . bismuth  subsalicylate (PEPTO BISMOL) 262 MG chewable tablet Chew 524 mg by mouth as needed.    . naproxen sodium (ANAPROX) 220 MG tablet Take 1 tablet by mouth 2 (two) times daily as needed.     No facility-administered medications prior to visit.     Allergies  Allergen Reactions  . Amoxicillin Other (See Comments)    Dizziness    ROS As per HPI  PE: Blood pressure 101/64, pulse 80, temperature 98.2 F (36.8 C), temperature source Oral, resp. rate 16, height 5\' 9"  (1.753 m), weight 168 lb 4 oz (76.3 kg), SpO2 98 %.  Orthostatics: supine 116/81, 71   Sitting 121/80, 84     Standing 123/89, 96  (Pt had no dizziness with orthostatic checks here today). Gen: Alert, well appearing.  Patient is oriented to person, place, time, and situation. AFFECT: pleasant, lucid thought and speech. ENT: Ears: EACs clear, normal epithelium.  TMs with good light reflex and landmarks bilaterally.  Eyes: no injection, icteris, swelling, or exudate.  EOMI, PERRLA. Nose: no drainage or turbinate edema/swelling.  No injection or focal lesion.  Mouth: lips without lesion/swelling.  Oral mucosa pink and moist.  Dentition intact and without obvious caries or gingival swelling.  Oropharynx without erythema, exudate, or swelling.  Neck - No masses or thyromegaly or limitation in range of motion.  Carotids 2+ w/out bruit. CV: RRR, no m/r/g.   LUNGS: CTA bilat, nonlabored resps, good aeration in all lung fields. EXT: no clubbing, cyanosis, or edema.  Neuro: CN 2-12 intact bilaterally, strength 5/5 in proximal and distal upper extremities and lower extremities bilaterally.  No tremor.  FNF normal bilat.  No ataxia.  Upper extremity and lower extremity DTRs symmetric.  No pronator drift.   LABS:   Lab Results  Component Value Date   WBC 6.6 08/03/2014   HGB 16.6 08/03/2014   HCT 48.4 08/03/2014   MCV 88.4 08/03/2014   PLT 214.0 08/03/2014   Lab Results  Component Value Date   CREATININE 0.87 08/03/2014   BUN 14  08/03/2014   NA 138 08/03/2014   K 4.3 08/03/2014   CL 103 08/03/2014   CO2 33 (H) 08/03/2014   Lab Results  Component Value Date   ALT 14 08/03/2014   AST 18 08/03/2014   ALKPHOS 70 08/03/2014   BILITOT 1.1 08/03/2014   IMPRESSION AND PLAN:  Orthostatic dizziness: no clear etiology or contributing factors.  His HR here today did increase significantly with each change in position. He has had a tendency towards this in the past. Reiterated need to wait briefly after sitting up or standing. Importance of good hydration discussed. No red flags for CV or neurologic problem. Doubtful that his recent worsening of allergic rhinitis is contributing to his sx's.  An After Visit Summary was printed and given to the patient.  FOLLOW UP: Return if symptoms worsen or fail to improve.  Signed:  Santiago Bumpers, MD           09/25/2016

## 2016-09-25 NOTE — Progress Notes (Signed)
A user error has taken place: encounter opened in error, closed for administrative reasons.

## 2016-09-25 NOTE — Progress Notes (Signed)
Pre visit review using our clinic review tool, if applicable. No additional management support is needed unless otherwise documented below in the visit note. 

## 2016-10-04 ENCOUNTER — Ambulatory Visit (INDEPENDENT_AMBULATORY_CARE_PROVIDER_SITE_OTHER): Payer: BLUE CROSS/BLUE SHIELD | Admitting: Family Medicine

## 2016-10-04 ENCOUNTER — Encounter: Payer: Self-pay | Admitting: Family Medicine

## 2016-10-04 VITALS — BP 126/78 | HR 72 | Temp 98.0°F | Resp 16 | Ht 69.0 in | Wt 167.8 lb

## 2016-10-04 DIAGNOSIS — Z125 Encounter for screening for malignant neoplasm of prostate: Secondary | ICD-10-CM

## 2016-10-04 DIAGNOSIS — Z0000 Encounter for general adult medical examination without abnormal findings: Secondary | ICD-10-CM

## 2016-10-04 DIAGNOSIS — Z114 Encounter for screening for human immunodeficiency virus [HIV]: Secondary | ICD-10-CM

## 2016-10-04 LAB — LIPID PANEL
CHOLESTEROL: 209 mg/dL — AB (ref 0–200)
HDL: 36.1 mg/dL — ABNORMAL LOW (ref >39.00)
LDL Cholesterol: 138 mg/dL — ABNORMAL HIGH (ref 0–99)
NONHDL: 172.87
Total CHOL/HDL Ratio: 6
Triglycerides: 174 mg/dL — ABNORMAL HIGH (ref 0.0–149.0)
VLDL: 34.8 mg/dL (ref 0.0–40.0)

## 2016-10-04 LAB — CBC WITH DIFFERENTIAL/PLATELET
BASOS PCT: 1 % (ref 0.0–3.0)
Basophils Absolute: 0.1 10*3/uL (ref 0.0–0.1)
EOS ABS: 0.1 10*3/uL (ref 0.0–0.7)
EOS PCT: 1.5 % (ref 0.0–5.0)
HCT: 50.6 % (ref 39.0–52.0)
HEMOGLOBIN: 17 g/dL (ref 13.0–17.0)
LYMPHS ABS: 1.6 10*3/uL (ref 0.7–4.0)
Lymphocytes Relative: 23.1 % (ref 12.0–46.0)
MCHC: 33.7 g/dL (ref 30.0–36.0)
MCV: 89.9 fl (ref 78.0–100.0)
MONO ABS: 0.5 10*3/uL (ref 0.1–1.0)
Monocytes Relative: 6.6 % (ref 3.0–12.0)
NEUTROS ABS: 4.8 10*3/uL (ref 1.4–7.7)
NEUTROS PCT: 67.8 % (ref 43.0–77.0)
Platelets: 262 10*3/uL (ref 150.0–400.0)
RBC: 5.63 Mil/uL (ref 4.22–5.81)
RDW: 13.2 % (ref 11.5–15.5)
WBC: 7.1 10*3/uL (ref 4.0–10.5)

## 2016-10-04 LAB — TSH: TSH: 1.38 u[IU]/mL (ref 0.35–4.50)

## 2016-10-04 LAB — COMPREHENSIVE METABOLIC PANEL
ALBUMIN: 4.2 g/dL (ref 3.5–5.2)
ALK PHOS: 72 U/L (ref 39–117)
ALT: 20 U/L (ref 0–53)
AST: 18 U/L (ref 0–37)
BUN: 14 mg/dL (ref 6–23)
CO2: 32 mEq/L (ref 19–32)
CREATININE: 0.92 mg/dL (ref 0.40–1.50)
Calcium: 9.5 mg/dL (ref 8.4–10.5)
Chloride: 101 mEq/L (ref 96–112)
GFR: 91.85 mL/min (ref >60.00)
GLUCOSE: 96 mg/dL (ref 70–99)
Potassium: 4.5 mEq/L (ref 3.5–5.1)
SODIUM: 139 meq/L (ref 135–145)
TOTAL PROTEIN: 7.1 g/dL (ref 6.0–8.3)
Total Bilirubin: 0.5 mg/dL (ref 0.2–1.2)

## 2016-10-04 LAB — PSA: PSA: 0.77 ng/mL (ref 0.10–4.00)

## 2016-10-04 NOTE — Patient Instructions (Signed)
 Health Maintenance, Male A healthy lifestyle and preventive care is important for your health and wellness. Ask your health care provider about what schedule of regular examinations is right for you. What should I know about weight and diet?  Eat a Healthy Diet  Eat plenty of vegetables, fruits, whole grains, low-fat dairy products, and lean protein.  Do not eat a lot of foods high in solid fats, added sugars, or salt. Maintain a Healthy Weight  Regular exercise can help you achieve or maintain a healthy weight. You should:  Do at least 150 minutes of exercise each week. The exercise should increase your heart rate and make you sweat (moderate-intensity exercise).  Do strength-training exercises at least twice a week. Watch Your Levels of Cholesterol and Blood Lipids  Have your blood tested for lipids and cholesterol every 5 years starting at 52 years of age. If you are at high risk for heart disease, you should start having your blood tested when you are 52 years old. You may need to have your cholesterol levels checked more often if:  Your lipid or cholesterol levels are high.  You are older than 52 years of age.  You are at high risk for heart disease. What should I know about cancer screening? Many types of cancers can be detected early and may often be prevented. Lung Cancer  You should be screened every year for lung cancer if:  You are a current smoker who has smoked for at least 30 years.  You are a former smoker who has quit within the past 15 years.  Talk to your health care provider about your screening options, when you should start screening, and how often you should be screened. Colorectal Cancer  Routine colorectal cancer screening usually begins at 52 years of age and should be repeated every 5-10 years until you are 52 years old. You may need to be screened more often if early forms of precancerous polyps or small growths are found. Your health care provider  may recommend screening at an earlier age if you have risk factors for colon cancer.  Your health care provider may recommend using home test kits to check for hidden blood in the stool.  A small camera at the end of a tube can be used to examine your colon (sigmoidoscopy or colonoscopy). This checks for the earliest forms of colorectal cancer. Prostate and Testicular Cancer  Depending on your age and overall health, your health care provider may do certain tests to screen for prostate and testicular cancer.  Talk to your health care provider about any symptoms or concerns you have about testicular or prostate cancer. Skin Cancer  Check your skin from head to toe regularly.  Tell your health care provider about any new moles or changes in moles, especially if:  There is a change in a mole's size, shape, or color.  You have a mole that is larger than a pencil eraser.  Always use sunscreen. Apply sunscreen liberally and repeat throughout the day.  Protect yourself by wearing long sleeves, pants, a wide-brimmed hat, and sunglasses when outside. What should I know about heart disease, diabetes, and high blood pressure?  If you are 18-39 years of age, have your blood pressure checked every 3-5 years. If you are 40 years of age or older, have your blood pressure checked every year. You should have your blood pressure measured twice-once when you are at a hospital or clinic, and once when you are not at   a hospital or clinic. Record the average of the two measurements. To check your blood pressure when you are not at a hospital or clinic, you can use:  An automated blood pressure machine at a pharmacy.  A home blood pressure monitor.  Talk to your health care provider about your target blood pressure.  If you are between 45-79 years old, ask your health care provider if you should take aspirin to prevent heart disease.  Have regular diabetes screenings by checking your fasting blood sugar  level.  If you are at a normal weight and have a low risk for diabetes, have this test once every three years after the age of 45.  If you are overweight and have a high risk for diabetes, consider being tested at a younger age or more often.  A one-time screening for abdominal aortic aneurysm (AAA) by ultrasound is recommended for men aged 65-75 years who are current or former smokers. What should I know about preventing infection? Hepatitis B  If you have a higher risk for hepatitis B, you should be screened for this virus. Talk with your health care provider to find out if you are at risk for hepatitis B infection. Hepatitis C  Blood testing is recommended for:  Everyone born from 1945 through 1965.  Anyone with known risk factors for hepatitis C. Sexually Transmitted Diseases (STDs)  You should be screened each year for STDs including gonorrhea and chlamydia if:  You are sexually active and are younger than 52 years of age.  You are older than 52 years of age and your health care provider tells you that you are at risk for this type of infection.  Your sexual activity has changed since you were last screened and you are at an increased risk for chlamydia or gonorrhea. Ask your health care provider if you are at risk.  Talk with your health care provider about whether you are at high risk of being infected with HIV. Your health care provider may recommend a prescription medicine to help prevent HIV infection. What else can I do?  Schedule regular health, dental, and eye exams.  Stay current with your vaccines (immunizations).  Do not use any tobacco products, such as cigarettes, chewing tobacco, and e-cigarettes. If you need help quitting, ask your health care provider.  Limit alcohol intake to no more than 2 drinks per day. One drink equals 12 ounces of beer, 5 ounces of wine, or 1 ounces of hard liquor.  Do not use street drugs.  Do not share needles.  Ask your health  care provider for help if you need support or information about quitting drugs.  Tell your health care provider if you often feel depressed.  Tell your health care provider if you have ever been abused or do not feel safe at home. This information is not intended to replace advice given to you by your health care provider. Make sure you discuss any questions you have with your health care provider. Document Released: 11/04/2007 Document Revised: 01/05/2016 Document Reviewed: 02/09/2015 Elsevier Interactive Patient Education  2017 Elsevier Inc.  

## 2016-10-04 NOTE — Progress Notes (Signed)
Office Note 10/04/2016  CC:  Chief Complaint  Patient presents with  . Annual Exam    Pt is fasting.     HPI:  Brian Osborne is a 51 y.o. male who is here for annual health maintenance exam.  Some allergic rhinitis sx's last couple weeks, some night-time cough that has been helped by albuterol HFA.  No SOB or wheezing.  No fevers.  EYES: no exam in last couple years. Dental preventatives UTD.  Exercise: physical job but no formal exercise regimen. Diet: limiting soda, otherwise says diet is not very good--eats out a lot.   Past Medical History:  Diagnosis Date  . GERD (gastroesophageal reflux disease)    Normal Bravo pH probe study 2010  . IBS (irritable bowel syndrome) 2016   w/diarrhea (celiac blood work neg)  . Right foot pain 05/2016   Inflammatory sesamoiditis (Dr. Charlsie Merlesegal)  . Vasomotor rhinitis     Past Surgical History:  Procedure Laterality Date  . abd ultrasound  03/2012   Normal  . COLONOSCOPY W/ POLYPECTOMY  03/02/15   benign lymphoid polyp: recall 10 yrs (Dr. Elnoria HowardHung)  . ESOPHAGOGASTRODUODENOSCOPY  12/2008   Mild duodenitis, biopsies neg. (Dr. Elnoria HowardHung)    History reviewed. No pertinent family history.  Social History   Social History  . Marital status: Divorced    Spouse name: N/A  . Number of children: N/A  . Years of education: N/A   Occupational History  . Not on file.   Social History Main Topics  . Smoking status: Former Games developermoker  . Smokeless tobacco: Never Used  . Alcohol use No  . Drug use: No  . Sexual activity: Not on file   Other Topics Concern  . Not on file   Social History Narrative   Married, 3 children all adults from, from first marriage.   Adopted, so FH unknown.   Educ: HS   Occup: Engineer, drillingTractor trailer driver   No signif cigarette use, but cigars up until 2011.   No alcohol or drugs.   No exercise.    Outpatient Medications Prior to Visit  Medication Sig Dispense Refill  . albuterol (VENTOLIN HFA) 108 (90 Base) MCG/ACT  inhaler Inhale 1-2 puffs into the lungs every 4 (four) hours as needed for wheezing or shortness of breath. 18 g 1  . azelastine (ASTELIN) 0.1 % nasal spray Place 2 sprays into both nostrils 2 (two) times daily. 30 mL 12  . bismuth subsalicylate (PEPTO BISMOL) 262 MG chewable tablet Chew 524 mg by mouth as needed.    . naproxen sodium (ANAPROX) 220 MG tablet Take 1 tablet by mouth 2 (two) times daily as needed.     No facility-administered medications prior to visit.     Allergies  Allergen Reactions  . Amoxicillin Other (See Comments)    Dizziness    ROS Review of Systems  Constitutional: Negative for appetite change, chills, fatigue and fever.  HENT: Positive for postnasal drip (see hpi). Negative for congestion, dental problem, ear pain and sore throat.   Eyes: Negative for discharge, redness and visual disturbance.  Respiratory: Positive for cough (see hpi). Negative for chest tightness, shortness of breath and wheezing.   Cardiovascular: Negative for chest pain, palpitations and leg swelling.  Gastrointestinal: Negative for abdominal pain, blood in stool, diarrhea, nausea and vomiting.  Genitourinary: Negative for difficulty urinating, dysuria, flank pain, frequency, hematuria and urgency.  Musculoskeletal: Negative for arthralgias, back pain, joint swelling, myalgias and neck stiffness.  Skin: Negative for pallor  and rash.  Neurological: Negative for dizziness, speech difficulty, weakness and headaches.  Hematological: Negative for adenopathy. Does not bruise/bleed easily.  Psychiatric/Behavioral: Negative for confusion and sleep disturbance. The patient is not nervous/anxious.     PE; Blood pressure 126/78, pulse 72, temperature 98 F (36.7 C), temperature source Oral, resp. rate 16, height 5\' 9"  (1.753 m), weight 167 lb 12 oz (76.1 kg), SpO2 98 %. Body mass index is 24.77 kg/m.  Gen: Alert, well appearing.  Patient is oriented to person, place, time, and situation. AFFECT:  pleasant, lucid thought and speech. ENT: Ears: EACs clear, normal epithelium.  TMs with good light reflex and landmarks bilaterally.  Eyes: no injection, icteris, swelling, or exudate.  EOMI, PERRLA. Nose: no drainage or turbinate edema/swelling.  No injection or focal lesion.  Mouth: lips without lesion/swelling.  Oral mucosa pink and moist.  Dentition intact and without obvious caries or gingival swelling.  Oropharynx without erythema, exudate, or swelling.  Neck: supple/nontender.  No LAD, mass, or TM.  Carotid pulses 2+ bilaterally, without bruits. CV: RRR, no m/r/g.   LUNGS: CTA bilat, nonlabored resps, good aeration in all lung fields. ABD: soft, NT, ND, BS normal.  No hepatospenomegaly or mass.  No bruits. EXT: no clubbing, cyanosis, or edema.  Musculoskeletal: no joint swelling, erythema, warmth, or tenderness.  ROM of all joints intact. Skin - no sores or suspicious lesions or rashes or color changes Rectal exam: negative without mass, lesions or tenderness, PROSTATE EXAM: smooth and symmetric without nodules or tenderness.   Pertinent labs:   Lab Results  Component Value Date   WBC 6.6 08/03/2014   HGB 16.6 08/03/2014   HCT 48.4 08/03/2014   MCV 88.4 08/03/2014   PLT 214.0 08/03/2014   Lab Results  Component Value Date   CREATININE 0.87 08/03/2014   BUN 14 08/03/2014   NA 138 08/03/2014   K 4.3 08/03/2014   CL 103 08/03/2014   CO2 33 (H) 08/03/2014   Lab Results  Component Value Date   ALT 14 08/03/2014   AST 18 08/03/2014   ALKPHOS 70 08/03/2014   BILITOT 1.1 08/03/2014    ASSESSMENT AND PLAN:   Health maintenance exam: Reviewed age and gender appropriate health maintenance issues (prudent diet, regular exercise, health risks of tobacco and excessive alcohol, use of seatbelts, fire alarms in home, use of sunscreen).  Also reviewed age and gender appropriate health screening as well as vaccine recommendations. Vaccines:UTD.  I made him aware of the availability  of new shingles vaccine (shingrix) but he declined this today. Labs: fasting HP + HIV screen. Prostate ca screening: DRE normal, PSA today. Colon cancer screening: next colonoscopy due 2026.  An After Visit Summary was printed and given to the patient.  FOLLOW UP:  Return for annual CPE (fasting).  Signed:  Santiago Bumpers, MD           10/04/2016

## 2016-10-05 LAB — HIV ANTIBODY (ROUTINE TESTING W REFLEX): HIV 1&2 Ab, 4th Generation: NONREACTIVE

## 2017-07-18 DIAGNOSIS — H903 Sensorineural hearing loss, bilateral: Secondary | ICD-10-CM | POA: Insufficient documentation

## 2017-07-18 DIAGNOSIS — H6123 Impacted cerumen, bilateral: Secondary | ICD-10-CM | POA: Insufficient documentation

## 2017-07-23 ENCOUNTER — Encounter: Payer: Self-pay | Admitting: Family Medicine

## 2017-10-22 ENCOUNTER — Encounter: Payer: Self-pay | Admitting: Family Medicine

## 2017-10-22 ENCOUNTER — Ambulatory Visit: Payer: BLUE CROSS/BLUE SHIELD | Admitting: Family Medicine

## 2017-10-22 VITALS — BP 132/81 | HR 71 | Temp 98.5°F | Resp 16 | Ht 69.0 in | Wt 175.4 lb

## 2017-10-22 DIAGNOSIS — X503XXA Overexertion from repetitive movements, initial encounter: Secondary | ICD-10-CM

## 2017-10-22 DIAGNOSIS — M25522 Pain in left elbow: Secondary | ICD-10-CM

## 2017-10-22 DIAGNOSIS — T148XXA Other injury of unspecified body region, initial encounter: Secondary | ICD-10-CM

## 2017-10-22 DIAGNOSIS — M79632 Pain in left forearm: Secondary | ICD-10-CM

## 2017-10-22 MED ORDER — AZELASTINE HCL 0.1 % NA SOLN: 2.0000 | Freq: Two times a day (BID) | NASAL | 1 refills | Status: DC

## 2017-10-22 MED ORDER — ALBUTEROL SULFATE 108 (90 BASE) MCG/ACT IN AEPB: 2.0000 | INHALATION_SPRAY | RESPIRATORY_TRACT | 1 refills | Status: DC | PRN

## 2017-10-22 NOTE — Progress Notes (Signed)
OFFICE VISIT  10/22/2017   CC:  Chief Complaint  Patient presents with  . Elbow Pain  . Arm Pain   HPI:    Patient is a 52 y.o.  male who presents for musculoskeletal complaints regarding his left arm. Onset L prox forearm pain about 6 wks ago--volar surface; wore elbow sleeve and this worked a couple weeks, then noted L elbow pain.  Forearm still pretty bothersome, elbow achy on/off.  NSAID/tylenol. Warm shower, better. No acute injury prior. He does to repetitive lifting at his job.  With activity he does well, but sitting/resting then things start to ache. No paresthesias in arm, no weakness.  No neck pain. No swelling of elbow.  Past Medical History:  Diagnosis Date  . Bilateral sensorineural hearing loss    + hearing aids  . GERD (gastroesophageal reflux disease)    Normal Bravo pH probe study 2010  . IBS (irritable bowel syndrome) 2016   w/diarrhea (celiac blood work neg)  . Right foot pain 05/2016   Inflammatory sesamoiditis (Dr. Charlsie Merlesegal)  . Vasomotor rhinitis     Past Surgical History:  Procedure Laterality Date  . abd ultrasound  03/2012   Normal  . COLONOSCOPY W/ POLYPECTOMY  03/02/15   benign lymphoid polyp: recall 10 yrs (Dr. Elnoria HowardHung)  . ESOPHAGOGASTRODUODENOSCOPY  12/2008   Mild duodenitis, biopsies neg. (Dr. Elnoria HowardHung)    Outpatient Medications Prior to Visit  Medication Sig Dispense Refill  . bismuth subsalicylate (PEPTO BISMOL) 262 MG chewable tablet Chew 524 mg by mouth as needed.    . naproxen sodium (ANAPROX) 220 MG tablet Take 1 tablet by mouth 2 (two) times daily as needed.    Marland Kitchen. albuterol (VENTOLIN HFA) 108 (90 Base) MCG/ACT inhaler Inhale 1-2 puffs into the lungs every 4 (four) hours as needed for wheezing or shortness of breath. 18 g 1  . azelastine (ASTELIN) 0.1 % nasal spray Place 2 sprays into both nostrils 2 (two) times daily. 30 mL 12   No facility-administered medications prior to visit.     Allergies  Allergen Reactions  . Amoxicillin Other  (See Comments)    Dizziness    ROS As per HPI  PE: Blood pressure 132/81, pulse 71, temperature 98.5 F (36.9 C), temperature source Oral, resp. rate 16, height 5\' 9"  (1.753 m), weight 175 lb 6 oz (79.5 kg), SpO2 97 %. Body mass index is 25.9 kg/m.  Gen: Alert, well appearing.  Patient is oriented to person, place, time, and situation. AFFECT: pleasant, lucid thought and speech. L elbow w/out any erythema, warmth, or swelling.  No tenderness to palpation over bony aspects of elbow or forearm. Olecranon normal.  No signif TTP anywhere on soft tissue of forearm or upper arm except very mild discomfort to palpation in proximal volar region L forearm.  No convincing TTP over lateral epicondyle or extensor MMs.  LABS:    Chemistry      Component Value Date/Time   NA 139 10/04/2016 1024   K 4.5 10/04/2016 1024   CL 101 10/04/2016 1024   CO2 32 10/04/2016 1024   BUN 14 10/04/2016 1024   CREATININE 0.92 10/04/2016 1024      Component Value Date/Time   CALCIUM 9.5 10/04/2016 1024   ALKPHOS 72 10/04/2016 1024   AST 18 10/04/2016 1024   ALT 20 10/04/2016 1024   BILITOT 0.5 10/04/2016 1024       IMPRESSION AND PLAN:  Left arm overuse injury: so sign of absolute tendonitis or soft tissue  tear.   No sign of bony abnormality. Continue sleeve as tolerated. May use naproxen 440 tid prn or tylenol 1000 mg tid prn. Rest the involved region as much as possible.  An After Visit Summary was printed and given to the patient.  FOLLOW UP: Return in about 6 months (around 04/23/2018) for annual CPE (fasting).  Signed:  Santiago Bumpers, MD           10/22/2017

## 2017-10-24 ENCOUNTER — Telehealth: Payer: Self-pay | Admitting: *Deleted

## 2017-10-24 DIAGNOSIS — T148XXA Other injury of unspecified body region, initial encounter: Secondary | ICD-10-CM

## 2017-10-24 DIAGNOSIS — M25522 Pain in left elbow: Secondary | ICD-10-CM

## 2017-10-24 DIAGNOSIS — X503XXA Overexertion from repetitive movements, initial encounter: Secondary | ICD-10-CM

## 2017-10-24 NOTE — Telephone Encounter (Signed)
OK, referral to [facility] [facility] ordered.

## 2017-10-24 NOTE — Telephone Encounter (Signed)
Copied from CRM [phone]#[id number]. Topic: Referral - Request >> Oct 24, 2017  3:18 PM Marylen PontoMcneil, Ja-Kwan wrote: Reason for CRM: Pt requesting a referral to an Orthopedic Specialist. Cb# [phone]

## 2017-10-24 NOTE — Telephone Encounter (Signed)
Please advise. Thanks.  

## 2017-10-25 NOTE — Telephone Encounter (Signed)
Left detailed message on cell vm, okay per DPR.  

## 2017-11-07 DIAGNOSIS — M25522 Pain in left elbow: Secondary | ICD-10-CM | POA: Insufficient documentation

## 2017-11-07 DIAGNOSIS — M7712 Lateral epicondylitis, left elbow: Secondary | ICD-10-CM | POA: Insufficient documentation

## 2017-11-08 ENCOUNTER — Encounter: Payer: Self-pay | Admitting: Family Medicine

## 2017-12-18 ENCOUNTER — Other Ambulatory Visit: Payer: Self-pay | Admitting: Family Medicine

## 2018-02-12 ENCOUNTER — Other Ambulatory Visit: Payer: Self-pay | Admitting: Family Medicine

## 2018-04-28 ENCOUNTER — Other Ambulatory Visit: Payer: Self-pay | Admitting: Family Medicine

## 2018-05-01 DIAGNOSIS — M25521 Pain in right elbow: Secondary | ICD-10-CM | POA: Insufficient documentation

## 2018-05-02 NOTE — Telephone Encounter (Signed)
Pt advised and voiced understanding.   

## 2018-05-07 ENCOUNTER — Encounter: Payer: Self-pay | Admitting: Family Medicine

## 2018-07-22 DIAGNOSIS — M7711 Lateral epicondylitis, right elbow: Secondary | ICD-10-CM | POA: Insufficient documentation

## 2018-12-09 ENCOUNTER — Other Ambulatory Visit: Payer: Self-pay | Admitting: Family Medicine

## 2018-12-09 ENCOUNTER — Telehealth: Payer: Self-pay

## 2018-12-09 MED ORDER — ALBUTEROL SULFATE HFA 108 (90 BASE) MCG/ACT IN AERS: 1.0000 | INHALATION_SPRAY | RESPIRATORY_TRACT | 0 refills | Status: DC | PRN

## 2018-12-09 NOTE — Telephone Encounter (Signed)
OK, ventolin rx will be sent.s

## 2018-12-09 NOTE — Telephone Encounter (Signed)
Received VM from pt requesting Ventolin HFA inhaler instead of what he is currently using( Proair Respiclick). He does not feel as though it works as well. The Ventolin HFA was d/c on 10/22/17. Pt's last OV was 10/22/17.  Please advise, thanks.

## 2018-12-09 NOTE — Telephone Encounter (Signed)
Patient advised of rx at pharmacy.

## 2018-12-23 ENCOUNTER — Telehealth: Payer: Self-pay | Admitting: Family Medicine

## 2018-12-23 NOTE — Telephone Encounter (Signed)
Please advise, thanks.

## 2018-12-23 NOTE — Telephone Encounter (Signed)
Can't see in office. Virtual visit appropriate but if he is SOB he needs to see emergency care.-thx

## 2018-12-23 NOTE — Telephone Encounter (Signed)
Patient reports SOB/Cough exposed to COVID 3 weeks ago, tested Negative, no better  Requested appt, because of his work schedule he could not come in until Friday 8/7 Scheduled virtual appt with Dr. Anitra Lauth on 8/7  Patient requests in office appt. Thinks he may have a touch of pneumonia and inquired about antibody testing  I did explain to him about not seeing patients in office with COVID symptoms. He understood but wanted to be advised by provider  Please contact patient

## 2018-12-24 NOTE — Telephone Encounter (Signed)
LM for pt to returncall

## 2018-12-24 NOTE — Telephone Encounter (Signed)
Patient has been scheduled for VV.

## 2018-12-27 ENCOUNTER — Ambulatory Visit (INDEPENDENT_AMBULATORY_CARE_PROVIDER_SITE_OTHER): Payer: BLUE CROSS/BLUE SHIELD | Admitting: Family Medicine

## 2018-12-27 ENCOUNTER — Encounter: Payer: Self-pay | Admitting: Family Medicine

## 2018-12-27 ENCOUNTER — Other Ambulatory Visit: Payer: Self-pay

## 2018-12-27 VITALS — Wt 176.0 lb

## 2018-12-27 DIAGNOSIS — J4531 Mild persistent asthma with (acute) exacerbation: Secondary | ICD-10-CM

## 2018-12-27 MED ORDER — FLOVENT HFA 110 MCG/ACT IN AERO: 1.0000 | INHALATION_SPRAY | Freq: Two times a day (BID) | RESPIRATORY_TRACT | 1 refills | Status: DC

## 2018-12-27 MED ORDER — PREDNISONE 10 MG PO TABS: ORAL_TABLET | ORAL | 0 refills | Status: DC

## 2018-12-27 NOTE — Progress Notes (Signed)
Virtual Visit via Video Note  I connected with pt on 12/27/18 at 11:30 AM EDT by a video enabled telemedicine application and verified that I am speaking with the correct person using two identifiers.  Location patient: home Location provider:work or home office Persons participating in the virtual visit: patient, provider  I discussed the limitations of evaluation and management by telemedicine and the availability of in person appointments. The patient expressed understanding and agreed to proceed.  Telemedicine visit is a necessity given the COVID-19 restrictions in place at the current time.  HPI: 53 y/o male non-smoker who is being seen today for cough.   Has mild persistent asthma, for the most part has had sx's in spring and summer.  Has never had to have prednisone, has never had to go to the ED or hosp for asthma, has never been on controller med for asthma. Describes 2 mo hx of recurrent wheezing, chest tightness, SOB, dry cough.  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  No LE swelling.  He can lie flat to sleep but must be prone to breath easy.  No PND.  No fever.  No loss of taste or smell.  No nasal congestion /runny nose, no sneezing, no ST.  No dizziness. No n/v/d.  No rash.  No HA's.  ROS: See pertinent positives and negatives per HPI.  Past Medical History:  Diagnosis Date  . Bilateral sensorineural hearing loss    + hearing aids  . GERD (gastroesophageal reflux disease)    Normal Bravo pH probe study 2010  . IBS (irritable bowel syndrome) 2016   w/diarrhea (celiac blood work neg)  . Left elbow pain Summer /2019   Lateral epicondylitis---Dr. Ranell PatrickNorris.  . Right elbow pain 04/2018   tennis elbow  . Right foot pain 05/2016   Inflammatory sesamoiditis (Dr. Charlsie Merlesegal)  . Vasomotor rhinitis     Past Surgical History:  Procedure Laterality Date  . abd ultrasound  03/2012   Normal  . COLONOSCOPY W/ POLYPECTOMY   03/02/15   benign lymphoid polyp: recall 10 yrs (Dr. Elnoria HowardHung)  . ESOPHAGOGASTRODUODENOSCOPY  12/2008   Mild duodenitis, biopsies neg. (Dr. Elnoria HowardHung)    No family history on file.  SOCIAL HX:  Social History   Socioeconomic History  . Marital status: Divorced    Spouse name: Not on file  . Number of children: Not on file  . Years of education: Not on file  . Highest education level: Not on file  Occupational History  . Not on file  Social Needs  . Financial resource strain: Not on file  . Food insecurity    Worry: Not on file    Inability: Not on file  . Transportation needs    Medical: Not on file    Non-medical: Not on file  Tobacco Use  . Smoking status: Former Games developermoker  . Smokeless tobacco: Never Used  Substance and Sexual Activity  . Alcohol use: No    Alcohol/week: 0.0 standard drinks  . Drug use: No  . Sexual activity: Not on file  Lifestyle  . Physical activity    Days per week: Not on file    Minutes per session: Not on file  . Stress: Not on file  Relationships  . Social Musicianconnections    Talks on phone: Not on file    Gets together: Not on file    Attends religious service: Not on file    Active member of club  or organization: Not on file    Attends meetings of clubs or organizations: Not on file    Relationship status: Not on file  Other Topics Concern  . Not on file  Social History Narrative   Married, 3 children all adults from, from first marriage.   Adopted, so FH unknown.   Educ: HS   Occup: Forensic scientist   No signif cigarette use, but cigars up until 2011.   No alcohol or drugs.   No exercise.      Current Outpatient Medications:  .  albuterol (VENTOLIN HFA) 108 (90 Base) MCG/ACT inhaler, Inhale 1-2 puffs into the lungs every 4 (four) hours as needed for wheezing or shortness of breath., Disp: 18 g, Rfl: 0 .  azelastine (ASTELIN) 0.1 % nasal spray, Place 2 sprays into both nostrils 2 (two) times daily. Needs Office Visit, Disp: 30 mL, Rfl:  0 .  bismuth subsalicylate (PEPTO BISMOL) 262 MG chewable tablet, Chew 524 mg by mouth as needed., Disp: , Rfl:  .  naproxen sodium (ANAPROX) 220 MG tablet, Take 1 tablet by mouth 2 (two) times daily as needed., Disp: , Rfl:   EXAM:  VITALS per patient if applicable: Wt 176 lb (79.8 kg)   BMI 25.99 kg/m    GENERAL: alert, oriented, appears well and in no acute distress  HEENT: atraumatic, conjunttiva clear, no obvious abnormalities on inspection of external nose and ears  NECK: normal movements of the head and neck  LUNGS: on inspection no signs of respiratory distress, breathing rate appears normal, no obvious gross SOB, gasping or wheezing  CV: no obvious cyanosis  MS: moves all visible extremities without noticeable abnormality  PSYCH/NEURO: pleasant and cooperative, no obvious depression or anxiety, speech and thought processing grossly intact  LABS: none today    Chemistry      Component Value Date/Time   NA 139 10/04/2016 1024   K 4.5 10/04/2016 1024   CL 101 10/04/2016 1024   CO2 32 10/04/2016 1024   BUN 14 10/04/2016 1024   CREATININE 0.92 10/04/2016 1024      Component Value Date/Time   CALCIUM 9.5 10/04/2016 1024   ALKPHOS 72 10/04/2016 1024   AST 18 10/04/2016 1024   ALT 20 10/04/2016 1024   BILITOT 0.5 10/04/2016 1024     Lab Results  Component Value Date   WBC 7.1 10/04/2016   HGB 17.0 10/04/2016   HCT 50.6 10/04/2016   MCV 89.9 10/04/2016   PLT 262.0 10/04/2016    ASSESSMENT AND PLAN:  Discussed the following assessment and plan:  1) Mild persistent asthma, poor control x 2 mo.  Heat and activity are his biggest problems lately. Start controller therapy: floven 110 mcg 1 puff bid.   Prednisone taper, with dose adjustment q4d: 40-30-20-10. Continue albuterol 2 p q4h prn. Therapeutic expectations and side effect profile of medication discussed today.  Patient's questions answered.  I discussed the assessment and treatment plan with the  patient. The patient was provided an opportunity to ask questions and all were answered. The patient agreed with the plan and demonstrated an understanding of the instructions.   The patient was advised to call back or seek an in-person evaluation if the symptoms worsen or if the condition fails to improve as anticipated.  F/u:  3-4 wks in office  Signed:  Crissie Sickles, MD           12/27/2018

## 2019-03-21 ENCOUNTER — Telehealth: Payer: BLUE CROSS/BLUE SHIELD | Admitting: Physician Assistant

## 2019-03-21 DIAGNOSIS — M546 Pain in thoracic spine: Secondary | ICD-10-CM

## 2019-03-21 NOTE — Progress Notes (Signed)
Hi Azari, I am sorry you are in pain.  I am concerned about a few of the details you included on your questionnaire and would feel more comfortable if you were seen in person.  If you cannot get in to see your PCP, [facility] offers Urgent Care visits (see below).     Based on what you shared with me, I feel your condition warrants further evaluation and I recommend that you be seen for a face to face office visit.  NOTE: If you entered your credit card information for this eVisit, you will not be charged. You may see a "hold" on your card for the $35 but that hold will drop off and you will not have a charge processed.  If you are having a true medical emergency please call 911.     For an urgent face to face visit, [facility] has four urgent care centers for your convenience:   . [facility]    [phone]                  Get Driving Directions  [address], [place] [address] . 10 am to 8 pm Monday-Friday . 12 pm to 8 pm Saturday-Sunday   . [facility] at [place]                  Get Driving Directions  [address], [place], [place] [address] . 8 am to 8 pm Monday-Friday . 9 am to 6 pm Saturday . 11 am to 6 pm Sunday   . [facility] at [place]                  Get Driving Directions   [address] [place], [place] [address] . 8 am to 8 pm Monday-Friday . 8 am to 4 pm Saturday-Sunday    . [facility] at [place]                    Get Driving Directions  [phone]  [address] [place], [place] [address]  . Monday-Friday, 12 PM to 6 PM    Your e-visit answers were reviewed by a board certified advanced clinical practitioner to complete your personal care plan.  Thank you for using e-Visits.

## 2019-04-24 ENCOUNTER — Other Ambulatory Visit: Payer: Self-pay

## 2019-04-25 ENCOUNTER — Ambulatory Visit: Payer: BLUE CROSS/BLUE SHIELD | Admitting: Family Medicine

## 2019-04-25 ENCOUNTER — Encounter: Payer: Self-pay | Admitting: Family Medicine

## 2019-04-25 VITALS — BP 145/77 | HR 82 | Temp 98.6°F | Resp 16 | Ht 69.0 in | Wt 190.0 lb

## 2019-04-25 DIAGNOSIS — R0600 Dyspnea, unspecified: Secondary | ICD-10-CM | POA: Diagnosis not present

## 2019-04-25 DIAGNOSIS — J4531 Mild persistent asthma with (acute) exacerbation: Secondary | ICD-10-CM

## 2019-04-25 DIAGNOSIS — M546 Pain in thoracic spine: Secondary | ICD-10-CM

## 2019-04-25 DIAGNOSIS — R0609 Other forms of dyspnea: Secondary | ICD-10-CM

## 2019-04-25 MED ORDER — PREDNISONE 10 MG PO TABS: ORAL_TABLET | ORAL | 0 refills | Status: DC

## 2019-04-25 NOTE — Patient Instructions (Signed)
Call your pharmacy and request refill of Fluticasone inhaler (FLOVENT is brand name). Take 1 puff twice per day every day.  Continue to use the albuterol inhaler AS NEEDED this is a "rescue" medication and should not be used unless you have shortness of breath and/or wheezing that is worse than usual.

## 2019-04-25 NOTE — Progress Notes (Signed)
OFFICE VISIT  04/25/2019   CC:  Chief Complaint  Patient presents with  . Back Pain    x 45month, FMLA   HPI:    Patient is a 53 y.o. male who presents for back pain. 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  No radiation.  Heating pad and hot showers and rest have helped. He went to [facility], was rx'd prednisone 40mg  qd x 5d, kept out of work until 03/27/19.  He currently feels no pain and is back to baseline level of health. He has been back at work since 11/5. He needs FMLA paperwork filled out.  Also,  He is still having some shortness of breath with mild exertion.  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   Sometimes hears/feels wheezing, occ cough that is mostly dry.  No fevers. No SOB at rest.  No CP.  No palp's, nausea, or dizziness. Has hx of mild persistent asthma, but I saw him 12/27/18 and he had been having either poor control chronically or acute flare.   I used prednisone 16 day taper and he started feeling a lot better withint 2 d of getting on this med. He only took the flovent I started him on a few days b/c he felt like he didn't need to use this med b/c he was getting so much better with prednisone. He says after predniosne finished he had gradual return of the DOE and intermittent wheezing/cough. Albuterol prn helps about 10 min after use. He used to smoke cigars but quit 10 yrs ago, some cigs prior to that but quit > 10 yrs ago.  ROS: no diaphoresis, no arm or jaw pain, no dizziness, no HAs, no rashes, no melena/hematochezia.  No polyuria or polydipsia.  No myalgias or arthralgias.   Past Medical History:  Diagnosis Date  . Bilateral sensorineural hearing loss    + hearing aids  . GERD (gastroesophageal reflux disease)    Normal Bravo pH probe study 2010  . IBS (irritable bowel syndrome) 2016   w/diarrhea (celiac blood work neg)  . Left  elbow pain Summer /2019   Lateral epicondylitis---Dr. 02-21-1997.  . Right elbow pain 04/2018   tennis elbow  . Right foot pain 05/2016   Inflammatory sesamoiditis (Dr. 06/2016)  . Vasomotor rhinitis     Past Surgical History:  Procedure Laterality Date  . abd ultrasound  03/2012   Normal  . COLONOSCOPY W/ POLYPECTOMY  03/02/15   benign lymphoid polyp: recall 10 yrs (Dr. 05/02/15)  . ESOPHAGOGASTRODUODENOSCOPY  12/2008   Mild duodenitis, biopsies neg. (Dr. 01/2009)   Social History   Socioeconomic History  . Marital status: Divorced    Spouse name: Not on file  . Number of children: Not on file  . Years of education: Not on file  . Highest education level: Not on file  Occupational History  . Not on file  Social Needs  . Financial resource strain: Not on file  . Food insecurity    Worry: Not on file    Inability: Not on file  . Transportation needs    Medical: Not on file    Non-medical: Not on file  Tobacco Use  . Smoking status: Former Elnoria Howard  . Smokeless tobacco: Never Used  Substance and Sexual Activity  . Alcohol use: No    Alcohol/week: 0.0 standard drinks  .  Drug use: No  . Sexual activity: Not on file  Lifestyle  . Physical activity    Days per week: Not on file    Minutes per session: Not on file  . Stress: Not on file  Relationships  . Social Musicianconnections    Talks on phone: Not on file    Gets together: Not on file    Attends religious service: Not on file    Active member of club or organization: Not on file    Attends meetings of clubs or organizations: Not on file    Relationship status: Not on file  Other Topics Concern  . Not on file  Social History Narrative   Married, 3 children all adults from, from first marriage.   Adopted, so FH unknown.   Educ: HS   Occup: Engineer, drillingTractor trailer driver   No signif cigarette use, but cigars up until 2011.   No alcohol or drugs.   No exercise.    Outpatient Medications Prior to Visit  Medication Sig Dispense Refill   . albuterol (VENTOLIN HFA) 108 (90 Base) MCG/ACT inhaler Inhale 1-2 puffs into the lungs every 4 (four) hours as needed for wheezing or shortness of breath. 18 g 0  . azelastine (ASTELIN) 0.1 % nasal spray Place 2 sprays into both nostrils 2 (two) times daily. Needs Office Visit 30 mL 0  . bismuth subsalicylate (PEPTO BISMOL) 262 MG chewable tablet Chew 524 mg by mouth as needed.    . fluticasone (FLOVENT HFA) 110 MCG/ACT inhaler Inhale 1 puff into the lungs 2 (two) times daily. 1 Inhaler 1  . naproxen sodium (ANAPROX) 220 MG tablet Take 1 tablet by mouth 2 (two) times daily as needed.    . predniSONE (DELTASONE) 10 MG tablet 4 tabs po qd x 4d, then 3 tabs po qd x 4d, then 2 tabs po qd x 4d, then 1 tab po qd x 4d 40 tablet 0   No facility-administered medications prior to visit.     Allergies  Allergen Reactions  . Amoxicillin Other (See Comments)    Dizziness    ROS As per HPI  PE: Blood pressure (!) 145/77, pulse 82, temperature 98.6 F (37 C), temperature source Temporal, resp. rate 16, height 5\' 9"  (1.753 m), weight 190 lb (86.2 kg), SpO2 94 %. Gen: Alert, well appearing.  Patient is oriented to person, place, time, and situation. AFFECT: pleasant, lucid thought and speech. WUJ:WJXBENT:Eyes: no injection, icteris, swelling, or exudate.  EOMI, PERRLA. Mouth: lips without lesion/swelling.  Oral mucosa pink and moist. Oropharynx without erythema, exudate, or swelling.  BACK: no tenderness of bony aspects or soft tissues.  ROM intact w/out pain/stiffness. UE and LE strength intact. CV: RRR, no m/r/g.   LUNGS: CTA bilat, nonlabored resps, good aeration in all lung fields. No wheeze with forced exhalation. EXT: no clubbing or cyanosis.  no edema.    LABS:    Chemistry      Component Value Date/Time   NA 139 10/04/2016 1024   K 4.5 10/04/2016 1024   CL 101 10/04/2016 1024   CO2 32 10/04/2016 1024   BUN 14 10/04/2016 1024   CREATININE 0.92 10/04/2016 1024      Component Value  Date/Time   CALCIUM 9.5 10/04/2016 1024   ALKPHOS 72 10/04/2016 1024   AST 18 10/04/2016 1024   ALT 20 10/04/2016 1024   BILITOT 0.5 10/04/2016 1024     IMPRESSION AND PLAN:  1) Left sided acute thoracic musculoskeletal back pain/strain.  Resolved with prednisone burst, rest, heat. He was out of work 10/29 - 11/5 and needs FMLA paperwork filled out.  2) DOE: I still feel like his sx's are most c/w persistent asthma, poor control. Will do another prednisone taper: 40 qd x 4d, 30 q x 4d, 20 qd x 4d, then 10 qd x 4d then stop. No imaging or PFTs at this time. RESTART fluticasine 161mcg HFA, 1 p bid, continue albut 2p q4h prn.  An After Visit Summary was printed and given to the patient.  FOLLOW UP: Return in about 2 months (around 06/26/2019) for in office f/u SOB/asthma.  Signed:  Crissie Sickles, MD           04/25/2019

## 2019-09-03 DIAGNOSIS — M545 Low back pain, unspecified: Secondary | ICD-10-CM | POA: Insufficient documentation

## 2019-09-09 ENCOUNTER — Other Ambulatory Visit: Payer: Self-pay | Admitting: Family Medicine

## 2019-09-12 ENCOUNTER — Encounter: Payer: Self-pay | Admitting: Family Medicine

## 2019-09-12 ENCOUNTER — Other Ambulatory Visit: Payer: Self-pay

## 2019-09-12 ENCOUNTER — Telehealth (INDEPENDENT_AMBULATORY_CARE_PROVIDER_SITE_OTHER): Payer: BLUE CROSS/BLUE SHIELD | Admitting: Family Medicine

## 2019-09-12 VITALS — Temp 98.3°F | Wt 177.0 lb

## 2019-09-12 DIAGNOSIS — R110 Nausea: Secondary | ICD-10-CM | POA: Diagnosis not present

## 2019-09-12 DIAGNOSIS — R5381 Other malaise: Secondary | ICD-10-CM | POA: Diagnosis not present

## 2019-09-12 DIAGNOSIS — U071 COVID-19: Secondary | ICD-10-CM

## 2019-09-12 DIAGNOSIS — R5383 Other fatigue: Secondary | ICD-10-CM

## 2019-09-12 DIAGNOSIS — R519 Headache, unspecified: Secondary | ICD-10-CM

## 2019-09-12 MED ORDER — PROMETHAZINE HCL 12.5 MG PO TABS: ORAL_TABLET | ORAL | 0 refills | Status: DC

## 2019-09-12 NOTE — Progress Notes (Signed)
Virtual Visit via Video Note  I connected with pt on 09/12/19 at 11:30 AM EDT by a video enabled telemedicine application and verified that I am speaking with the correct person using two identifiers.  Location patient: home Location provider:work or home office Persons participating in the virtual visit: patient, provider  I discussed the limitations of evaluation and management by telemedicine and the availability of in person appointments. The patient expressed understanding and agreed to proceed.  Telemedicine visit is a necessity given the COVID-19 restrictions in place at the current time.  HPI: 54 y/o male being seen today for "still having issues after having covid infection 2 wks ago". Covid + [facility] approx 08/30/19. Still poor energy and feeling nausea w/out vomiting.  No abd pain or diarrhea. +HA.  No SOB or wheezing.  Tm 100.4 in evenings but stays normal in mornings. No body aches.  Not taking any meds.  He had been taking flovent  Up until this illness and has been doing fine regarding asthma sx's.  ROS: no CP, no SOB, no wheezing, no cough, no dizziness,no rashes, no melena/hematochezia.  No polyuria or polydipsia.  No myalgias or arthralgias.  No focal weakness, paresthesias, or tremors.  No acute vision or hearing abnormalities. No n/v/d or abd pain.  No palpitations.     Past Medical History:  Diagnosis Date  . Bilateral sensorineural hearing loss    + hearing aids  . GERD (gastroesophageal reflux disease)    Normal Bravo pH probe study 2010  . IBS (irritable bowel syndrome) 2016   w/diarrhea (celiac blood work neg)  . Left elbow pain Summer /2019   Lateral epicondylitis---Dr. Ranell Patrick.  . Persistent asthma with undetermined severity   . Right elbow pain 04/2018   tennis elbow  . Right foot pain 05/2016   Inflammatory sesamoiditis (Dr. Charlsie Merles)  . Vasomotor rhinitis     Past Surgical History:  Procedure Laterality Date  . abd ultrasound  03/2012   Normal  .  COLONOSCOPY W/ POLYPECTOMY  03/02/15   benign lymphoid polyp: recall 10 yrs (Dr. Elnoria Howard)  . ESOPHAGOGASTRODUODENOSCOPY  12/2008   Mild duodenitis, biopsies neg. (Dr. Elnoria Howard)    History reviewed. No pertinent family history.  Social History   Socioeconomic History  . Marital status: Divorced    Spouse name: Not on file  . Number of children: Not on file  . Years of education: Not on file  . Highest education level: Not on file  Occupational History  . Not on file  Tobacco Use  . Smoking status: Former Games developer  . Smokeless tobacco: Never Used  Substance and Sexual Activity  . Alcohol use: No    Alcohol/week: 0.0 standard drinks  . Drug use: No  . Sexual activity: Not on file  Other Topics Concern  . Not on file  Social History Narrative   Married, 3 children all adults from, from first marriage.   Adopted, so FH unknown.   Educ: HS   Occup: Engineer, drilling   No signif cigarette use, but cigars up until 2011.   No alcohol or drugs.   No exercise.   Social Determinants of Health   Financial Resource Strain:   . Difficulty of Paying Living Expenses:   Food Insecurity:   . Worried About Programme researcher, broadcasting/film/video in the Last Year:   . Barista in the Last Year:   Transportation Needs:   . Freight forwarder (Medical):   Marland Kitchen Lack of Transportation (  Non-Medical):   Physical Activity:   . Days of Exercise per Week:   . Minutes of Exercise per Session:   Stress:   . Feeling of Stress :   Social Connections:   . Frequency of Communication with Friends and Family:   . Frequency of Social Gatherings with Friends and Family:   . Attends Religious Services:   . Active Member of Clubs or Organizations:   . Attends Archivist Meetings:   Marland Kitchen Marital Status:       Current Outpatient Medications:  .  azelastine (ASTELIN) 0.1 % nasal spray, Place 2 sprays into both nostrils 2 (two) times daily. Needs Office Visit, Disp: 30 mL, Rfl: 0 .  albuterol (VENTOLIN HFA)  108 (90 Base) MCG/ACT inhaler, Inhale 1-2 puffs into the lungs every 4 (four) hours as needed for wheezing or shortness of breath. (Patient not taking: Reported on 09/12/2019), Disp: 18 g, Rfl: 0 .  bismuth subsalicylate (PEPTO BISMOL) 262 MG chewable tablet, Chew 524 mg by mouth as needed., Disp: , Rfl:  .  FLOVENT HFA 110 MCG/ACT inhaler, TAKE 1 PUFF BY MOUTH TWICE A DAY (Patient not taking: Reported on 09/12/2019), Disp: 12 g, Rfl: 0 .  naproxen sodium (ANAPROX) 220 MG tablet, Take 1 tablet by mouth 2 (two) times daily as needed., Disp: , Rfl:   EXAM:  VITALS per patient if applicable: Temp 23.5 F (36.8 C) (Oral)   Wt 177 lb (80.3 kg)   BMI 26.14 kg/m    GENERAL: alert, oriented, appears well and in no acute distress  HEENT: atraumatic, conjunttiva clear, no obvious abnormalities on inspection of external nose and ears  NECK: normal movements of the head and neck  LUNGS: on inspection no signs of respiratory distress, breathing rate appears normal, no obvious gross SOB, gasping or wheezing  CV: no obvious cyanosis  MS: moves all visible extremities without noticeable abnormality  PSYCH/NEURO: pleasant and cooperative, no obvious depression or anxiety, speech and thought processing grossly intact  LABS: none today    Chemistry      Component Value Date/Time   NA 139 10/04/2016 1024   K 4.5 10/04/2016 1024   CL 101 10/04/2016 1024   CO2 32 10/04/2016 1024   BUN 14 10/04/2016 1024   CREATININE 0.92 10/04/2016 1024      Component Value Date/Time   CALCIUM 9.5 10/04/2016 1024   ALKPHOS 72 10/04/2016 1024   AST 18 10/04/2016 1024   ALT 20 10/04/2016 1024   BILITOT 0.5 10/04/2016 1024     Lab Results  Component Value Date   WBC 7.1 10/04/2016   HGB 17.0 10/04/2016   HCT 50.6 10/04/2016   MCV 89.9 10/04/2016   PLT 262.0 10/04/2016   Lab Results  Component Value Date   TSH 1.38 10/04/2016   ASSESSMENT AND PLAN:  Discussed the following assessment and  plan:  Covid 19 infection, lingering symptoms. No sign of complications such as pneumonia, very low suspicion of sepsis.  Likely not dehydrated. Discussed expectation of gradual resolution of symptoms over the next 1-2 weeks, possibly even as much as 4-6 wks. Phenergan 12.5mg , 1-2 q6h prn.  Continue good hydration efforts. Aleve/tylenol/motrin as needed for fever or HA. Discussed necessary quarantine of minimum of 14 d from onset of sx's PLUS must have 3 consec days with improvement in symptoms AND no fever OFF OF ANTIPYRETIC. Pt expressed understanding. I extended his out of work rec to tentative return date of 09/22/19. See in 1 wk  for recheck.   I discussed the assessment and treatment plan with the patient. The patient was provided an opportunity to ask questions and all were answered. The patient agreed with the plan and demonstrated an understanding of the instructions.   The patient was advised to call back or seek an in-person evaluation if the symptoms worsen or if the condition fails to improve as anticipated.  F/u: 1 wk  Signed:  Santiago Bumpers, MD           09/12/2019

## 2019-09-15 ENCOUNTER — Telehealth: Payer: Self-pay

## 2019-09-15 NOTE — Telephone Encounter (Signed)
Received short term disability paperwork for pt, pre-filled PCP name and office information. Placed on PCP desk to review and sign, if appropriate.

## 2019-09-16 NOTE — Telephone Encounter (Signed)
OK, paperwork completed.

## 2019-09-16 NOTE — Telephone Encounter (Signed)
Copy of forms made for pt's chart. Original faxed. LM for pt to return call to notify of this.

## 2019-09-17 NOTE — Telephone Encounter (Signed)
Patient notified. Scheduled for 1 week f/u, 4/29

## 2019-09-18 ENCOUNTER — Other Ambulatory Visit: Payer: Self-pay

## 2019-09-18 ENCOUNTER — Encounter: Payer: Self-pay | Admitting: Family Medicine

## 2019-09-18 ENCOUNTER — Telehealth (INDEPENDENT_AMBULATORY_CARE_PROVIDER_SITE_OTHER): Payer: BLUE CROSS/BLUE SHIELD | Admitting: Family Medicine

## 2019-09-18 DIAGNOSIS — R42 Dizziness and giddiness: Secondary | ICD-10-CM | POA: Diagnosis not present

## 2019-09-18 DIAGNOSIS — R110 Nausea: Secondary | ICD-10-CM | POA: Diagnosis not present

## 2019-09-18 DIAGNOSIS — U071 COVID-19: Secondary | ICD-10-CM | POA: Diagnosis not present

## 2019-09-18 DIAGNOSIS — R5383 Other fatigue: Secondary | ICD-10-CM

## 2019-09-18 MED ORDER — HYDROCODONE-ACETAMINOPHEN 5-325 MG PO TABS: 1.0000 | ORAL_TABLET | Freq: Four times a day (QID) | ORAL | 0 refills | Status: DC | PRN

## 2019-09-18 MED ORDER — ONDANSETRON HCL 8 MG PO TABS: 8.0000 mg | ORAL_TABLET | Freq: Three times a day (TID) | ORAL | 0 refills | Status: DC | PRN

## 2019-09-18 NOTE — Progress Notes (Signed)
Virtual Visit via Video Note  I connected with pt on 09/18/19 at  4:00 PM EDT by a video enabled telemedicine application and verified that I am speaking with the correct person using two identifiers.  Location patient: home Location provider:work or home office Persons participating in the virtual visit: patient, provider  I discussed the limitations of evaluation and management by telemedicine and the availability of in person appointments. The patient expressed understanding and agreed to proceed.  Telemedicine visit is a necessity given the COVID-19 restrictions in place at the current time.  HPI: 54 y/o WM being seen today for 1 wk f/u covid 19 infection. Has been having significant lingering fatigue and nausea/anorexia.  At the time of last visit he was still having temp up to 100.4 some evenings.  Essentially no respiratory symptoms. I rx'd phenergan last visit, recommended ibup or tyl or aleve prn, and extended his out of work period to 09/22/19.  INTERIM HX: No better at all. Nausea w/out vomiting, signif fatigue, can't do much other than walk around in his house w/out feeling easily winded.  +HA moderate and fairly constant.  Having intermittent periods of significant vertigo with increase in nausea during these times, lasts 5 seconds or so.  Some orthostatic dizziness at times. NO fever, cough, SOB at rest, wheezing, CP, ST, diarrhea, rash, myalgias, arthralgias, or abd pain. No neck stiffness.  No focal weakness or slurred speech. Eating fairly well.  Phenergan 25mg  per dose is not helping any. Aleve/ibup makes his stomach more upset. He is quite upset, emotional, on the edge of tears a lot.   It has been very hard for him to feel so bad for so long.  Also he is upset b/c he had to miss his mom's funeral in due to his illness. 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  ROS: See pertinent positives and negatives per HPI.  Past Medical  History:  Diagnosis Date  . Bilateral sensorineural hearing loss    + hearing aids  . GERD (gastroesophageal reflux disease)    Normal Bravo pH probe study 2010  . IBS (irritable bowel syndrome) 2016   w/diarrhea (celiac blood work neg)  . Left elbow pain Summer /2019   Lateral epicondylitis---Dr. 02-21-1997.  . Persistent asthma with undetermined severity   . Right elbow pain 04/2018   tennis elbow  . Right foot pain 05/2016   Inflammatory sesamoiditis (Dr. 06/2016)  . Vasomotor rhinitis     Past Surgical History:  Procedure Laterality Date  . abd ultrasound  03/2012   Normal  . COLONOSCOPY W/ POLYPECTOMY  03/02/15   benign lymphoid polyp: recall 10 yrs (Dr. 05/02/15)  . ESOPHAGOGASTRODUODENOSCOPY  12/2008   Mild duodenitis, biopsies neg. (Dr. 01/2009)    History reviewed. No pertinent family history.  Social hx: Married, 3 adult children. Elnoria Howard. No T/A/Ds.   Current Outpatient Medications:  .  azelastine (ASTELIN) 0.1 % nasal spray, Place 2 sprays into both nostrils 2 (two) times daily. Needs Office Visit, Disp: 30 mL, Rfl: 0 .  bismuth subsalicylate (PEPTO BISMOL) 262 MG chewable tablet, Chew 524 mg by mouth as needed., Disp: , Rfl:  .  FLOVENT HFA 110 MCG/ACT inhaler, TAKE 1 PUFF BY MOUTH TWICE A DAY, Disp: 12 g, Rfl: 0 .  naproxen sodium (ANAPROX) 220 MG tablet, Take 1 tablet by mouth 2 (two) times daily as needed., Disp: , Rfl:  .  promethazine (PHENERGAN) 12.5 MG tablet, 1-2  tabs po q6h prn nausea, Disp: 42 tablet, Rfl: 0 .  albuterol (VENTOLIN HFA) 108 (90 Base) MCG/ACT inhaler, Inhale 1-2 puffs into the lungs every 4 (four) hours as needed for wheezing or shortness of breath. (Patient not taking: Reported on 09/12/2019), Disp: 18 g, Rfl: 0  EXAM:  VITALS per patient if applicable: There were no vitals taken for this visit.   GENERAL: alert, oriented, appears well and in no acute distress  HEENT: atraumatic, conjunttiva clear, no obvious abnormalities on  inspection of external nose and ears  NECK: normal movements of the head and neck  LUNGS: on inspection no signs of respiratory distress, breathing rate appears normal, no obvious gross SOB, gasping or wheezing  CV: no obvious cyanosis  MS: moves all visible extremities without noticeable abnormality  PSYCH/NEURO: pleasant and cooperative, no obvious depression or anxiety, speech and thought processing grossly intact  LABS: none today    Chemistry      Component Value Date/Time   NA 139 10/04/2016 1024   K 4.5 10/04/2016 1024   CL 101 10/04/2016 1024   CO2 32 10/04/2016 1024   BUN 14 10/04/2016 1024   CREATININE 0.92 10/04/2016 1024      Component Value Date/Time   CALCIUM 9.5 10/04/2016 1024   ALKPHOS 72 10/04/2016 1024   AST 18 10/04/2016 1024   ALT 20 10/04/2016 1024   BILITOT 0.5 10/04/2016 1024     Lab Results  Component Value Date   WBC 7.1 10/04/2016   HGB 17.0 10/04/2016   HCT 50.6 10/04/2016   MCV 89.9 10/04/2016   PLT 262.0 10/04/2016    ASSESSMENT AND PLAN:  Discussed the following assessment and plan:  1) Covid 19 infection: significant persistent symptoms-->nausea, HA, lightheaded, also some vertigo that is not convincingly positional.  Taste and smell impaired.  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  He never had any respiratory symptoms. His grief and guilt surrounding his mom's passing and his not being able to go to her funeral are weighing on him but so is the shear fact of feeling so physically awful for so long. Will research covid literature and see if anything available regarding specific treatments besides symptomatic care. In the meantime, switch phenergan to trial of zofran 8mg  tid prn. Also stop ibup/aleve and start vicodin 5/325, 1-2 q6h prn. Therapeutic expectations and side effect profile of medication discussed today.  Patient's questions answered. Signs/symptoms to call or return for were reviewed and pt expressed  understanding. Will extend his time out of work another week, with the tentative return to work date being 09/29/19. I discussed the assessment and treatment plan with the patient. The patient was provided an opportunity to ask questions and all were answered. The patient agreed with the plan and demonstrated an understanding of the instructions.   The patient was advised to call back or seek an in-person evaluation if the symptoms worsen or if the condition fails to improve as anticipated.  F/u: 1 wk  Signed:  Crissie Sickles, MD           09/18/2019

## 2019-09-22 ENCOUNTER — Telehealth: Payer: Self-pay | Admitting: Family Medicine

## 2019-09-22 NOTE — Telephone Encounter (Signed)
R'cd [employer] paperwork for STD. Given to CMA.

## 2019-09-22 NOTE — Telephone Encounter (Signed)
Short term disability forms faxed on 4/27 but now company would like updated form with o/v notes from 4/29 and 5/6 visits. Will hold off on sending until after 5/6 visit. Placed on your desk until then.

## 2019-09-25 ENCOUNTER — Telehealth (INDEPENDENT_AMBULATORY_CARE_PROVIDER_SITE_OTHER): Payer: BLUE CROSS/BLUE SHIELD | Admitting: Family Medicine

## 2019-09-25 ENCOUNTER — Encounter: Payer: Self-pay | Admitting: Family Medicine

## 2019-09-25 ENCOUNTER — Other Ambulatory Visit: Payer: Self-pay

## 2019-09-25 VITALS — Ht 69.0 in | Wt 173.0 lb

## 2019-09-25 DIAGNOSIS — U071 COVID-19: Secondary | ICD-10-CM

## 2019-09-25 DIAGNOSIS — R110 Nausea: Secondary | ICD-10-CM | POA: Diagnosis not present

## 2019-09-25 DIAGNOSIS — R42 Dizziness and giddiness: Secondary | ICD-10-CM

## 2019-09-25 NOTE — Progress Notes (Signed)
Virtual Visit via Video Note  I connected with pt on 09/25/19 at  4:00 PM EDT by a video enabled telemedicine application and verified that I am speaking with the correct person using two identifiers.  Location patient: home Location provider:work or home office Persons participating in the virtual visit: patient, provider  I discussed the limitations of evaluation and management by telemedicine and the availability of in person appointments. The patient expressed understanding and agreed to proceed.  Telemedicine visit is a necessity given the COVID-19 restrictions in place at the current time.  HPI: 54 y/o WM being seen today for 7 day f/u covid 19 infection. Feeling MUCH better. Rare spinning sensation when he rolls over in bed, lasts a few seconds. Otherwise no prob w/dizziness or spinning.  No more nausea, just some impaired taste. HAs much improved.  He did try some zofran and vicodin but doesn't think these helped any. No fevers, no cough, no sob, no diarrhea. He wants to return to work 5/9 instead of 5/10 as we had planned after last visit.    ROS: See pertinent positives and negatives per HPI.  Past Medical History:  Diagnosis Date  . Bilateral sensorineural hearing loss    + hearing aids  . GERD (gastroesophageal reflux disease)    Normal Bravo pH probe study 2010  . IBS (irritable bowel syndrome) 2016   w/diarrhea (celiac blood work neg)  . Left elbow pain Summer /2019   Lateral epicondylitis---Dr. Ranell Patrick.  . Persistent asthma with undetermined severity   . Right elbow pain 04/2018   tennis elbow  . Right foot pain 05/2016   Inflammatory sesamoiditis (Dr. Charlsie Merles)  . Vasomotor rhinitis     Past Surgical History:  Procedure Laterality Date  . abd ultrasound  03/2012   Normal  . COLONOSCOPY W/ POLYPECTOMY  03/02/15   benign lymphoid polyp: recall 10 yrs (Dr. Elnoria Howard)  . ESOPHAGOGASTRODUODENOSCOPY  12/2008   Mild duodenitis, biopsies neg. (Dr. Elnoria Howard)    No family  history on file.  Social History   Socioeconomic History  . Marital status: Divorced    Spouse name: Not on file  . Number of children: Not on file  . Years of education: Not on file  . Highest education level: Not on file  Occupational History  . Not on file  Tobacco Use  . Smoking status: Former Games developer  . Smokeless tobacco: Never Used  Substance and Sexual Activity  . Alcohol use: No    Alcohol/week: 0.0 standard drinks  . Drug use: No  . Sexual activity: Not on file  Other Topics Concern  . Not on file  Social History Narrative   Married, 3 children all adults from, from first marriage.   Adopted, so FH unknown.   Educ: HS   Occup: Engineer, drilling   No signif cigarette use, but cigars up until 2011.   No alcohol or drugs.   No exercise.   Social Determinants of Health   Financial Resource Strain:   . Difficulty of Paying Living Expenses:   Food Insecurity:   . Worried About Programme researcher, broadcasting/film/video in the Last Year:   . Barista in the Last Year:   Transportation Needs:   . Freight forwarder (Medical):   Marland Kitchen Lack of Transportation (Non-Medical):   Physical Activity:   . Days of Exercise per Week:   . Minutes of Exercise per Session:   Stress:   . Feeling of Stress :  Social Connections:   . Frequency of Communication with Friends and Family:   . Frequency of Social Gatherings with Friends and Family:   . Attends Religious Services:   . Active Member of Clubs or Organizations:   . Attends Archivist Meetings:   Marland Kitchen Marital Status:       Current Outpatient Medications:  .  albuterol (VENTOLIN HFA) 108 (90 Base) MCG/ACT inhaler, Inhale 1-2 puffs into the lungs every 4 (four) hours as needed for wheezing or shortness of breath., Disp: 18 g, Rfl: 0 .  azelastine (ASTELIN) 0.1 % nasal spray, Place 2 sprays into both nostrils 2 (two) times daily. Needs Office Visit, Disp: 30 mL, Rfl: 0 .  bismuth subsalicylate (PEPTO BISMOL) 262 MG chewable  tablet, Chew 524 mg by mouth as needed., Disp: , Rfl:  .  FLOVENT HFA 110 MCG/ACT inhaler, TAKE 1 PUFF BY MOUTH TWICE A DAY, Disp: 12 g, Rfl: 0 .  HYDROcodone-acetaminophen (NORCO/VICODIN) 5-325 MG tablet, Take 1-2 tablets by mouth every 6 (six) hours as needed for moderate pain., Disp: 42 tablet, Rfl: 0 .  naproxen sodium (ANAPROX) 220 MG tablet, Take 1 tablet by mouth 2 (two) times daily as needed., Disp: , Rfl:  .  ondansetron (ZOFRAN) 8 MG tablet, Take 1 tablet (8 mg total) by mouth every 8 (eight) hours as needed for nausea or vomiting., Disp: 30 tablet, Rfl: 0 .  promethazine (PHENERGAN) 12.5 MG tablet, 1-2 tabs po q6h prn nausea, Disp: 42 tablet, Rfl: 0  EXAM:  VITALS per patient if applicable: Ht 5\' 9"  (1.753 m)   Wt 173 lb (78.5 kg)   BMI 25.55 kg/m    GENERAL: alert, oriented, appears well and in no acute distress  HEENT: atraumatic, conjunttiva clear, no obvious abnormalities on inspection of external nose and ears  NECK: normal movements of the head and neck  LUNGS: on inspection no signs of respiratory distress, breathing rate appears normal, no obvious gross SOB, gasping or wheezing  CV: no obvious cyanosis  MS: moves all visible extremities without noticeable abnormality  PSYCH/NEURO: pleasant and cooperative, no obvious depression or anxiety, speech and thought processing grossly intact  LABS: none today    Chemistry      Component Value Date/Time   NA 139 10/04/2016 1024   K 4.5 10/04/2016 1024   CL 101 10/04/2016 1024   CO2 32 10/04/2016 1024   BUN 14 10/04/2016 1024   CREATININE 0.92 10/04/2016 1024      Component Value Date/Time   CALCIUM 9.5 10/04/2016 1024   ALKPHOS 72 10/04/2016 1024   AST 18 10/04/2016 1024   ALT 20 10/04/2016 1024   BILITOT 0.5 10/04/2016 1024     ASSESSMENT AND PLAN:  Discussed the following assessment and plan:  Covid 19 infection; lingering nausea, vertigo/dizziness, HAs-->? 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 MUCH IMPROVED.   He wants to return to work 09/28/19. I will do his FMLA and e-mail to him as per his request. No new rx's today. Continue to gradually increase activity level, stay hydrated.   I discussed the assessment and treatment plan with the patient. The patient was provided an opportunity to ask questions and all were answered. The patient agreed with the plan and demonstrated an understanding of the instructions.   The patient was advised to call back or seek an in-person evaluation if the symptoms worsen or if the condition fails to improve as anticipated.  F/u: if not continuing to gradually improve.  Signed:  Abbe Amsterdam  Lanayah Gartley, MD           09/25/2019

## 2019-09-26 DIAGNOSIS — Z0279 Encounter for issue of other medical certificate: Secondary | ICD-10-CM

## 2019-10-05 ENCOUNTER — Other Ambulatory Visit: Payer: Self-pay | Admitting: Family Medicine

## 2019-10-09 ENCOUNTER — Other Ambulatory Visit: Payer: Self-pay | Admitting: Family Medicine

## 2019-10-09 MED ORDER — AZELASTINE HCL 0.1 % NA SOLN: 2.0000 | Freq: Two times a day (BID) | NASAL | 1 refills | Status: DC

## 2019-12-08 ENCOUNTER — Other Ambulatory Visit: Payer: Self-pay | Admitting: Family Medicine

## 2019-12-22 ENCOUNTER — Other Ambulatory Visit: Payer: Self-pay | Admitting: Family Medicine

## 2020-06-23 ENCOUNTER — Telehealth: Payer: Self-pay

## 2020-06-23 ENCOUNTER — Encounter: Payer: Self-pay | Admitting: Family Medicine

## 2020-06-23 NOTE — Telephone Encounter (Signed)
Please advise on message below.

## 2020-06-23 NOTE — Telephone Encounter (Signed)
Pls put him on my schedule at 4 oclock on Friday Feb 4th. Block the 4:15 that day if not already blocked-thx

## 2020-06-23 NOTE — Telephone Encounter (Signed)
Patient requesting appt on Friday, 2/4 to see Dr. Milinda Cave. He states that he is having pain around his belly button.  Onset: Sunday 1/30.  He reports no other symptoms. He said he is a "worry wort" and is concerned.  Due to patient work schedule, he works evenings thru 3rd shift. He is off on Fridays, and he is able to come anytime morning or afternoon. Dr. Milinda Cave has no openings on Friday.  Does he want to work him in or try to schedule with another [facility] provider?  Patient can be reached at [phone]

## 2020-06-24 ENCOUNTER — Other Ambulatory Visit: Payer: Self-pay

## 2020-06-24 DIAGNOSIS — R112 Nausea with vomiting, unspecified: Secondary | ICD-10-CM

## 2020-06-24 NOTE — Telephone Encounter (Signed)
Attempted to contact pt to get scheduled. If pt calls, please assist with scheduling.

## 2020-06-24 NOTE — Telephone Encounter (Signed)
Patient returned call and accepted appointment 06/25/20 at 4:00 pm.

## 2020-06-24 NOTE — ED Provider Notes (Signed)
ED Provider Notes by Darrin Luis, MD at 06/24/20 2347                Author: Darrin Luis, MD  Service: EMERGENCY  Author Type: Physician       Filed: 06/25/20 0139  Date of Service: 06/24/20 2347  Status: Signed          Editor: Izak Anding, Myrle Sheng, MD (Physician)               EMERGENCY DEPARTMENT HISTORY AND PHYSICAL EXAM           Date: 06/24/2020   Patient Name: Justin Duarte        History of Presenting Illness          Chief Complaint       Patient presents with        ?  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  She states he had a protein shake and began vomiting about 30 minutes later.  States he vomited multiple times, now feels crappy.  He  stopped his truck by roadside and called EMS because of vomiting.  EMS noted vomiting at least 4 times at scene.  He was given Zofran and has vomited once since.  He states nausea has improved.  He had periumbilical pain yesterday for which he was going  to see his doctor but does not have any today.  He denies any fever, chills, chest or abdomen pain.  Last bowel movement was today.      There are no other complaints, changes, or physical findings at this time.      PCP: No primary care provider on file.        No current facility-administered medications on file prior to encounter.          No current outpatient medications on file prior to encounter.             Past History        Past Medical History:     Past Medical History:        Diagnosis  Date         ?  Asthma             Past Surgical History:   History reviewed. No pertinent surgical history.      Family History:   History reviewed. No pertinent family history.      Social History:     Social History          Tobacco Use         ?  Smoking status:  Never Smoker     ?  Smokeless tobacco:  Never Used        Substance Use Topics         ?  Alcohol use:  Not on file         ?  Drug use:  Not on file           Allergies:     Allergies        Allergen  Reactions         ?  Amoxicillin  Vertigo  Review of Systems        Review of Systems    Constitutional: Negative for chills and fever.    HENT: Negative for congestion and sore throat.     Respiratory: Negative for cough and shortness of breath.     Cardiovascular: Negative for chest pain and palpitations.    Gastrointestinal: Positive for abdominal pain, nausea  and vomiting.    Genitourinary: Negative for dysuria, flank pain and frequency.    Musculoskeletal: Negative for arthralgias, joint swelling and myalgias.    Skin: Negative for color change and wound.    Neurological: Negative for dizziness, weakness, light-headedness and headaches.    Hematological: Negative for adenopathy.            Physical Exam        Physical Exam   Vitals and nursing note reviewed.   Constitutional:        General: He is not in acute distress.     Appearance: He is well-developed. He is not diaphoretic.      Comments: Patient appears in no acute distress.    HENT :       Head: Normocephalic and atraumatic. No right periorbital erythema or left periorbital erythema.      Jaw: No trismus.      Right Ear: External ear normal. No drainage or swelling. Tympanic membrane is not perforated, erythematous or bulging.       Left Ear: External ear normal. No drainage or swelling. Tympanic membrane is not perforated, erythematous or bulging.      Nose: Nose normal. No mucosal edema or rhinorrhea.      Right Sinus: No maxillary sinus tenderness or frontal sinus tenderness.       Left Sinus: No maxillary sinus tenderness or frontal sinus tenderness.      Mouth/Throat:      Mouth: No oral lesions.      Dentition: No dental abscesses.      Pharynx: Uvula  midline. No oropharyngeal exudate, posterior oropharyngeal erythema or uvula swelling.      Tonsils: No tonsillar abscesses.   Eyes:        General: No scleral icterus.        Right eye: No discharge.         Left eye: No discharge.      Conjunctiva/sclera: Conjunctivae normal.   Cardiovascular :       Rate and Rhythm: Normal rate and regular rhythm.      Heart sounds: Normal heart sounds. No murmur heard.   No friction rub. No gallop.     Pulmonary:       Effort: Pulmonary effort is normal. No tachypnea, accessory muscle usage or respiratory distress.      Breath sounds: Normal breath sounds . No decreased breath sounds, wheezing, rhonchi or rales.   Abdominal:      General: Bowel sounds are normal. There is no distension.       Palpations: Abdomen is soft.      Tenderness: There is no abdominal tenderness.      Comments: Abdomen is flat soft and nontender.  No palpable hernia periumbilically.      Musculoskeletal:          General: No tenderness. Normal range of motion.      Cervical back: Normal range of motion and neck supple.     Lymphadenopathy:       Cervical: No cervical adenopathy.   Skin :      General:  Skin is warm and dry.   Neurological :       Mental Status: He is alert and oriented to person, place, and time.    Psychiatric:         Judgment: Judgment normal.               Lab and Diagnostic Study Results        Labs -         Recent Results (from the past 12 hour(s))     CBC WITH AUTOMATED DIFF          Collection Time: 06/24/20 11:50 PM         Result  Value  Ref Range            WBC  8.1  4.6 - 13.2 K/uL       RBC  5.09  4.35 - 5.65 M/uL       HGB  16.0  13.0 - 16.0 g/dL       HCT  46.1  36.0 - 48.0 %       MCV  90.6  78.0 - 100.0 FL       MCH  31.4  24.0 - 34.0 PG       MCHC  34.7  31.0 - 37.0 g/dL       RDW  12.0  11.6 - 14.5 %       PLATELET  214  135 - 420 K/uL       MPV  10.2  9.2 - 11.8 FL       NRBC  0.0  0.0 PER 100 WBC       ABSOLUTE NRBC  0.00  0.00 - 0.01 K/uL       NEUTROPHILS  65  40 - 73 %       LYMPHOCYTES  26  21 - 52 %       MONOCYTES  8  3 - 10 %       EOSINOPHILS  1  0 - 5 %       BASOPHILS  0  0 - 2 %       IMMATURE  GRANULOCYTES  0  0 - 0.5 %       ABS. NEUTROPHILS  5.3  1.8 - 8.0 K/UL       ABS. LYMPHOCYTES  2.1  0.9 - 3.6 K/UL       ABS. MONOCYTES  0.6  0.05 - 1.2 K/UL       ABS. EOSINOPHILS  0.1  0.0 - 0.4 K/UL       ABS. BASOPHILS  0.0  0.0 - 0.1 K/UL       ABS. IMM. GRANS.  0.0  0.00 - 0.04 K/UL       DF  AUTOMATED          METABOLIC PANEL, COMPREHENSIVE          Collection Time: 06/24/20 11:50 PM         Result  Value  Ref Range            Sodium  140  135 - 145 mmol/L       Potassium  3.4  3.2 - 5.1 mmol/L       Chloride  101  94 - 111 mmol/L       CO2  26  21 - 33 mmol/L       Anion gap  13  mmol/L  Glucose  118 (H)  70 - 110 mg/dL       BUN  13  9 - 21 mg/dL       Creatinine  0.90  0.8 - 1.50 mg/dL       BUN/Creatinine ratio  14          GFR est AA  >60  ml/min/1.67m       GFR est non-AA  >60  ml/min/1.739m      Calcium  9.2  8.5 - 10.5 mg/dL       Bilirubin, total  0.5  0.2 - 1.0 mg/dL       AST (SGOT)  26  17 - 74 U/L       ALT (SGPT)  23  3 - 72 U/L       Alk. phosphatase  58  38 - 126 U/L       Protein, total  6.9  6.1 - 8.4 g/dL       Albumin  4.2  3.5 - 4.7 g/dL       Globulin  2.7  g/dL       A-G Ratio  1.6          LIPASE          Collection Time: 06/24/20 11:50 PM         Result  Value  Ref Range            Lipase  28  10 - 57 U/L       MAGNESIUM          Collection Time: 06/24/20 11:50 PM         Result  Value  Ref Range            Magnesium  1.9  1.7 - 2.8 mg/dL           Radiologic Studies -    '@lastxrresult'$ @     CT Results   (Last 48 hours)          None                 CXR Results   (Last 48 hours)          None                       Medical Decision Making     - I am the first provider for this patient.      - I reviewed the vital signs, available nursing notes, past medical history, past surgical history, family history and social history.      - Initial assessment performed. The patients presenting problems have been discussed, and they are in agreement with the care plan formulated and outlined  with them.  I have encouraged them to ask questions as they arise throughout their visit.      Vital Signs-Reviewed the patient's vital signs.   Patient Vitals for the past 12 hrs:            Temp  Pulse  Resp  BP  SpO2            06/24/20 2338  97.6 ??F (36.4 ??C)  81  17  122/76  94 %           Records Reviewed: Nursing Notes and Old Medical Records      The patient presents with vomiting with a differential diagnosis of indigestion, gastritis, GERD, medication reaction, dehydration, lecture light abnormalities, bowel  obstruction         ED Course:        Feels much better after hydration and Zofran.  Suspect vomiting was induced by protein shake.  He has no abdomen pain or tenderness.      Provider Notes (Medical Decision Making):               Procedures     Medical Decision Makingedical Decision Making           Disposition     Disposition: Condition stable and improved   DC- Adult Discharges: All of the diagnostic tests were reviewed and questions answered. Diagnosis, care plan and treatment options were discussed.  The patient understands the instructions and will follow up as directed. The patients results have been  reviewed with them.  They have been counseled regarding their diagnosis.  The patient verbally convey understanding and agreement of the signs, symptoms, diagnosis, treatment and prognosis and additionally agrees to follow up as recommended with their  PCP in 24 - 48 hours.  They also agree with the care-plan and convey that all of their questions have been answered.  I have also put together some discharge instructions for them that include: 1) educational information regarding their diagnosis, 2)  how to care for their diagnosis at home, as well a 3) list of reasons why they would want to return to the ED prior to their follow-up appointment, should their condition change.      Diagnosis:       1.  Non-intractable vomiting with nausea, unspecified vomiting type               Disposition:             Follow-up Information               Follow up With  Specialties  Details  Why  Contact Info              [facility] EMERGENCY DEPT  Emergency Medicine    If symptoms worsen  [place]   [phone]                  Patient's Medications          No medications on file           DISCHARGE PLAN:   1. There are no discharge medications for this patient.      2.      Follow-up Information               Follow up With  Specialties  Details  Why  Contact Info              [facility] EMERGENCY DEPT  Emergency Medicine    If symptoms worsen  [place]   [phone]             3.  Return to ED if worse    4. There are no discharge medications for this patient.              Diagnosis        Clinical Impression:       1.  Non-intractable vomiting with nausea, unspecified vomiting type            Attestations:      Yandiel Bergum S Penn Grissett, MD      Please note that this dictation was completed with Dragon, the  computer [employer].  Quite often unanticipated grammatical, syntax, homophones, and other interpretive errors are inadvertently  transcribed by the computer software.  Please disregard these errors.  Please excuse any errors that have escaped final proofreading.  Thank you.

## 2020-06-24 NOTE — ED Notes (Signed)
Patient arrived via EMS complaining of nausea and vomiting. Patient was driving when he had some mild abdominal pain and then 20 minutes ago started vomiting. Patient was given 4 mg Zofran en route.

## 2020-06-25 ENCOUNTER — Ambulatory Visit: Payer: BLUE CROSS/BLUE SHIELD | Admitting: Family Medicine

## 2020-06-25 ENCOUNTER — Inpatient Hospital Stay
Admit: 2020-06-25 | Discharge: 2020-06-25 | Disposition: A | Discharge status: Home / Self Care | Payer: BLUE CROSS/BLUE SHIELD | Attending: Emergency Medicine

## 2020-06-25 LAB — CBC WITH AUTOMATED DIFF
ABS. BASOPHILS: 0 10*3/uL (ref 0.0–0.1)
ABS. EOSINOPHILS: 0.1 10*3/uL (ref 0.0–0.4)
ABS. IMM. GRANS.: 0 10*3/uL (ref 0.00–0.04)
ABS. LYMPHOCYTES: 2.1 10*3/uL (ref 0.9–3.6)
ABS. MONOCYTES: 0.6 10*3/uL (ref 0.05–1.2)
ABS. NEUTROPHILS: 5.3 10*3/uL (ref 1.8–8.0)
ABSOLUTE NRBC: 0 10*3/uL (ref 0.00–0.01)
BASOPHILS: 0 % (ref 0–2)
EOSINOPHILS: 1 % (ref 0–5)
HCT: 46.1 % (ref 36.0–48.0)
HGB: 16 g/dL (ref 13.0–16.0)
IMMATURE GRANULOCYTES: 0 % (ref 0–0.5)
LYMPHOCYTES: 26 % (ref 21–52)
MCH: 31.4 PG (ref 24.0–34.0)
MCHC: 34.7 g/dL (ref 31.0–37.0)
MCV: 90.6 FL (ref 78.0–100.0)
MONOCYTES: 8 % (ref 3–10)
MPV: 10.2 FL (ref 9.2–11.8)
NEUTROPHILS: 65 % (ref 40–73)
NRBC: 0 PER 100 WBC
PLATELET: 214 10*3/uL (ref 135–420)
RBC: 5.09 M/uL (ref 4.35–5.65)
RDW: 12 % (ref 11.6–14.5)
WBC: 8.1 10*3/uL (ref 4.6–13.2)

## 2020-06-25 LAB — METABOLIC PANEL, COMPREHENSIVE
A-G Ratio: 1.6
ALT (SGPT): 23 U/L (ref 3–72)
AST (SGOT): 26 U/L (ref 17–74)
Albumin: 4.2 g/dL (ref 3.5–4.7)
Alk. phosphatase: 58 U/L (ref 38–126)
Anion gap: 13 mmol/L
BUN/Creatinine ratio: 14
BUN: 13 mg/dL (ref 9–21)
Bilirubin, total: 0.5 mg/dL (ref 0.2–1.0)
CO2: 26 mmol/L (ref 21–33)
Calcium: 9.2 mg/dL (ref 8.5–10.5)
Chloride: 101 mmol/L (ref 94–111)
Creatinine: 0.9 mg/dL (ref 0.8–1.50)
GFR est AA: >60 mL/min/{1.73_m2}
GFR est non-AA: >60 mL/min/{1.73_m2}
Globulin: 2.7 g/dL
Glucose: 118 mg/dL — ABNORMAL HIGH (ref 70–110)
Potassium: 3.4 mmol/L (ref 3.2–5.1)
Protein, total: 6.9 g/dL (ref 6.1–8.4)
Sodium: 140 mmol/L (ref 135–145)

## 2020-06-25 LAB — MAGNESIUM
Magnesium: 1.9 mg/dL (ref 1.7–2.8)
Magnesium: 1.9 mg/dL (ref 1.7–2.8)

## 2020-06-25 LAB — LIPASE
Lipase: 28 U/L (ref 10–57)
Lipase: 28 U/L (ref 10–57)

## 2020-06-25 LAB — COMPREHENSIVE METABOLIC PANEL
ALT: 23 U/L (ref 3–72)
AST: 26 U/L (ref 17–74)
Albumin/Globulin Ratio: 1.6
Albumin: 4.2 g/dL (ref 3.5–4.7)
Alkaline Phosphatase: 58 U/L (ref 38–126)
Anion Gap: 13 mmol/L
BUN: 13 mg/dL (ref 9–21)
Bun/Cre Ratio: 14
CO2: 26 mmol/L (ref 21–33)
Calcium: 9.2 mg/dL (ref 8.5–10.5)
Chloride: 101 mmol/L (ref 94–111)
Creatinine: 0.9 mg/dL (ref 0.8–1.50)
EGFR IF NonAfrican American: >60 mL/min/{1.73_m2}
GFR African American: >60 mL/min/{1.73_m2}
Globulin: 2.7 g/dL
Glucose: 118 mg/dL — ABNORMAL HIGH (ref 70–110)
Potassium: 3.4 mmol/L (ref 3.2–5.1)
Sodium: 140 mmol/L (ref 135–145)
Total Bilirubin: 0.5 mg/dL (ref 0.2–1.0)
Total Protein: 6.9 g/dL (ref 6.1–8.4)

## 2020-06-25 LAB — CBC WITH AUTO DIFFERENTIAL
Basophils %: 0 % (ref 0–2)
Basophils Absolute: 0 10*3/uL (ref 0.0–0.1)
Eosinophils %: 1 % (ref 0–5)
Eosinophils Absolute: 0.1 10*3/uL (ref 0.0–0.4)
Granulocyte Absolute Count: 0 10*3/uL (ref 0.00–0.04)
Hematocrit: 46.1 % (ref 36.0–48.0)
Hemoglobin: 16 g/dL (ref 13.0–16.0)
Immature Granulocytes: 0 % (ref 0–0.5)
Lymphocytes %: 26 % (ref 21–52)
Lymphocytes Absolute: 2.1 10*3/uL (ref 0.9–3.6)
MCH: 31.4 PG (ref 24.0–34.0)
MCHC: 34.7 g/dL (ref 31.0–37.0)
MCV: 90.6 FL (ref 78.0–100.0)
MPV: 10.2 FL (ref 9.2–11.8)
Monocytes %: 8 % (ref 3–10)
Monocytes Absolute: 0.6 10*3/uL (ref 0.05–1.2)
NRBC Absolute: 0 10*3/uL (ref 0.00–0.01)
Neutrophils %: 65 % (ref 40–73)
Neutrophils Absolute: 5.3 10*3/uL (ref 1.8–8.0)
Nucleated RBCs: 0 PER 100 WBC
Platelets: 214 10*3/uL (ref 135–420)
RBC: 5.09 M/uL (ref 4.35–5.65)
RDW: 12 % (ref 11.6–14.5)
WBC: 8.1 10*3/uL (ref 4.6–13.2)

## 2020-06-25 MED ORDER — OMEPRAZOLE 20 MG CAP, DELAYED RELEASE
20 mg | ORAL_CAPSULE | Freq: Every day | ORAL | 0 refills | Status: AC
Start: 2020-06-25 — End: ?

## 2020-06-25 MED ORDER — PANTOPRAZOLE 40 MG IV SOLR
40 mg | Freq: Once | INTRAVENOUS | Status: AC
Start: 2020-06-25 — End: 2020-06-24
  Administered 2020-06-25: 05:00:00 via INTRAVENOUS

## 2020-06-25 MED ORDER — SODIUM CHLORIDE 0.9% BOLUS IV
0.9 % | Freq: Once | INTRAVENOUS | Status: AC
Start: 2020-06-25 — End: 2020-06-25
  Administered 2020-06-25: 05:00:00 via INTRAVENOUS

## 2020-06-25 MED ORDER — ONDANSETRON 4 MG TAB, RAPID DISSOLVE
4 mg | ORAL_TABLET | Freq: Three times a day (TID) | ORAL | 0 refills | Status: AC | PRN
Start: 2020-06-25 — End: ?

## 2020-06-25 MED ORDER — ONDANSETRON (PF) 4 MG/2 ML INJECTION
4 mg/2 mL | INTRAMUSCULAR | Status: AC
Start: 2020-06-25 — End: 2020-06-24
  Administered 2020-06-25: 05:00:00 via INTRAVENOUS

## 2020-06-25 MED FILL — SODIUM CHLORIDE 0.9 % IV: INTRAVENOUS | Qty: 1000

## 2020-06-25 MED FILL — ONDANSETRON (PF) 4 MG/2 ML INJECTION: 4 mg/2 mL | INTRAMUSCULAR | Qty: 2

## 2020-06-25 MED FILL — PANTOPRAZOLE 40 MG IV SOLR: 40 mg | INTRAVENOUS | Qty: 40

## 2020-06-25 NOTE — ED Notes (Signed)
I have reviewed discharge instructions with the patient.  The patient verbalized understanding. Patient ambulatory out to ED lobby.

## 2020-08-25 ENCOUNTER — Other Ambulatory Visit: Payer: Self-pay

## 2020-08-26 ENCOUNTER — Encounter: Payer: Self-pay | Admitting: Family Medicine

## 2020-08-26 ENCOUNTER — Other Ambulatory Visit: Payer: Self-pay

## 2020-08-26 ENCOUNTER — Ambulatory Visit (INDEPENDENT_AMBULATORY_CARE_PROVIDER_SITE_OTHER): Payer: BLUE CROSS/BLUE SHIELD | Admitting: Family Medicine

## 2020-08-26 VITALS — BP 109/74 | HR 83 | Temp 98.0°F | Resp 16 | Ht 69.0 in | Wt 176.4 lb

## 2020-08-26 DIAGNOSIS — K219 Gastro-esophageal reflux disease without esophagitis: Secondary | ICD-10-CM | POA: Diagnosis not present

## 2020-08-26 DIAGNOSIS — J4530 Mild persistent asthma, uncomplicated: Secondary | ICD-10-CM | POA: Diagnosis not present

## 2020-08-26 DIAGNOSIS — J309 Allergic rhinitis, unspecified: Secondary | ICD-10-CM | POA: Diagnosis not present

## 2020-08-26 MED ORDER — FLOVENT HFA 110 MCG/ACT IN AERO: INHALATION_SPRAY | RESPIRATORY_TRACT | 11 refills | Status: DC

## 2020-08-26 MED ORDER — ALBUTEROL SULFATE HFA 108 (90 BASE) MCG/ACT IN AERS: 1.0000 | INHALATION_SPRAY | RESPIRATORY_TRACT | 1 refills | Status: DC | PRN

## 2020-08-26 MED ORDER — AZELASTINE HCL 0.1 % NA SOLN: 2.0000 | Freq: Two times a day (BID) | NASAL | 11 refills | Status: DC

## 2020-08-26 NOTE — Progress Notes (Signed)
OFFICE VISIT  08/26/2020  CC:  Chief Complaint  Patient presents with  . Follow-up    Asthma, GERD   HPI:    Patient is a 55 y.o. Caucasian male who presents for f/u allergic rhinitis, asthma, and GERD. Doing pretty good.  Started flovent and astelin regularly last couple months and these have been helping. Flovent 1 p bid.  Has rare need for albuterol rescue---about 2 times a month.  No exercise.  Busy driving tractor trailors. Going to a car show in [place] this weekend. He does not smoke. His nausea/gerd issues have been resolved since he quit drinking sodas!  Past Medical History:  Diagnosis Date  . Bilateral sensorineural hearing loss    + hearing aids  . GERD (gastroesophageal reflux disease)    Normal Bravo pH probe study 2010  . IBS (irritable bowel syndrome) 2016   w/diarrhea (celiac blood work neg)  . Persistent asthma with undetermined severity   . Vasomotor rhinitis     Past Surgical History:  Procedure Laterality Date  . abd ultrasound  03/2012   Normal  . COLONOSCOPY W/ POLYPECTOMY  03/02/15   benign lymphoid polyp: recall 10 yrs (Dr. Elnoria Howard)  . ESOPHAGOGASTRODUODENOSCOPY  12/2008   Mild duodenitis, biopsies neg. (Dr. Elnoria Howard)    Outpatient Medications Prior to Visit  Medication Sig Dispense Refill  . naproxen sodium (ANAPROX) 220 MG tablet Take 1 tablet by mouth 2 (two) times daily as needed.    Marland Kitchen albuterol (VENTOLIN HFA) 108 (90 Base) MCG/ACT inhaler Inhale 1-2 puffs into the lungs every 4 (four) hours as needed for wheezing or shortness of breath. 18 g 0  . azelastine (ASTELIN) 0.1 % nasal spray PLACE 2 SPRAYS INTO BOTH NOSTRILS 2 (TWO) TIMES DAILY. 30 mL 1  . bismuth subsalicylate (PEPTO BISMOL) 262 MG chewable tablet Chew 524 mg by mouth as needed.    Marland Kitchen FLOVENT HFA 110 MCG/ACT inhaler INHALE 1 PUFF BY MOUTH TWICE A DAY 12 Inhaler 1  . HYDROcodone-acetaminophen (NORCO/VICODIN) 5-325 MG tablet Take 1-2 tablets by mouth every 6 (six) hours as needed for  moderate pain. (Patient not taking: Reported on 08/26/2020) 42 tablet 0  . omeprazole (PRILOSEC) 20 MG capsule Take 1 capsule by mouth daily. (Patient not taking: Reported on 08/26/2020)    . ondansetron (ZOFRAN) 8 MG tablet Take 1 tablet (8 mg total) by mouth every 8 (eight) hours as needed for nausea or vomiting. (Patient not taking: Reported on 08/26/2020) 30 tablet 0  . promethazine (PHENERGAN) 12.5 MG tablet 1-2 tabs po q6h prn nausea (Patient not taking: Reported on 08/26/2020) 42 tablet 0   No facility-administered medications prior to visit.    Allergies  Allergen Reactions  . Amoxicillin Other (See Comments)    Dizziness    ROS As per HPI  PE: Vitals with BMI 08/26/2020 09/25/2019 09/12/2019  Height 5\' 9"  5\' 9"  -  Weight 176 lbs 6 oz 173 lbs 177 lbs  BMI 26.04 25.54 -  Systolic 109 - -  Diastolic 74 - -  Pulse 83 - -     Gen: Alert, well appearing.  Patient is oriented to person, place, time, and situation. AFFECT: pleasant, lucid thought and speech. CV: RRR, no m/r/g.   LUNGS: CTA bilat, nonlabored resps, good aeration in all lung fields. EXT: no clubbing or cyanosis.  no edema.    LABS:    Chemistry      Component Value Date/Time   NA 139 10/04/2016 1024   K 4.5  10/04/2016 1024   CL 101 10/04/2016 1024   CO2 32 10/04/2016 1024   BUN 14 10/04/2016 1024   CREATININE 0.92 10/04/2016 1024      Component Value Date/Time   CALCIUM 9.5 10/04/2016 1024   ALKPHOS 72 10/04/2016 1024   AST 18 10/04/2016 1024   ALT 20 10/04/2016 1024   BILITOT 0.5 10/04/2016 1024     Lab Results  Component Value Date   WBC 7.1 10/04/2016   HGB 17.0 10/04/2016   HCT 50.6 10/04/2016   MCV 89.9 10/04/2016   PLT 262.0 10/04/2016   Lab Results  Component Value Date   CHOL 209 (H) 10/04/2016   HDL 36.10 (L) 10/04/2016   LDLCALC 138 (H) 10/04/2016   TRIG 174.0 (H) 10/04/2016   CHOLHDL 6 10/04/2016   Lab Results  Component Value Date   TSH 1.38 10/04/2016    IMPRESSION AND  PLAN:  1) Mild persistent asthma: stable. Cont flovent , 1 p bid and cont albut hfa 1-2p q4h prn rescue. Cont astelin nasal spray for allergic rhinitis. RF's for all 3 meds done today.  2) GERD: doing great now OFF meds. He was drinking pepsi excessively and ever since he cut that out his GER has been nonexistent.  An After Visit Summary was printed and given to the patient.  FOLLOW UP: Return in about 6 months (around 02/25/2021) for annual CPE (fasting).  Signed:  Santiago Bumpers, MD           08/26/2020

## 2020-12-08 ENCOUNTER — Encounter: Payer: Self-pay | Admitting: Family Medicine

## 2021-08-18 ENCOUNTER — Ambulatory Visit: Payer: BLUE CROSS/BLUE SHIELD | Admitting: Family Medicine

## 2021-08-18 ENCOUNTER — Encounter: Payer: Self-pay | Admitting: Family Medicine

## 2021-08-18 VITALS — BP 115/81 | HR 85 | Temp 98.0°F | Ht 69.0 in | Wt 179.0 lb

## 2021-08-18 DIAGNOSIS — R1013 Epigastric pain: Secondary | ICD-10-CM

## 2021-08-18 DIAGNOSIS — R351 Nocturia: Secondary | ICD-10-CM

## 2021-08-18 DIAGNOSIS — N410 Acute prostatitis: Secondary | ICD-10-CM | POA: Diagnosis not present

## 2021-08-18 DIAGNOSIS — K209 Esophagitis, unspecified without bleeding: Secondary | ICD-10-CM

## 2021-08-18 LAB — POCT URINALYSIS DIPSTICK
Bilirubin, UA: NEGATIVE
Blood, UA: NEGATIVE
Glucose, UA: NEGATIVE
Ketones, UA: NEGATIVE
Leukocytes, UA: NEGATIVE
Nitrite, UA: NEGATIVE
Protein, UA: POSITIVE — AB
Spec Grav, UA: >=1.03 — AB (ref 1.010–1.025)
Urobilinogen, UA: 0.2 E.U./dL
pH, UA: 5.5 (ref 5.0–8.0)

## 2021-08-18 MED ORDER — MELOXICAM 15 MG PO TABS: 15.0000 mg | ORAL_TABLET | Freq: Every day | ORAL | 0 refills | Status: AC

## 2021-08-18 MED ORDER — OMEPRAZOLE 40 MG PO CPDR: 40.0000 mg | DELAYED_RELEASE_CAPSULE | Freq: Every day | ORAL | 0 refills | Status: DC

## 2021-08-18 MED ORDER — SULFAMETHOXAZOLE-TRIMETHOPRIM 800-160 MG PO TABS: 1.0000 | ORAL_TABLET | Freq: Two times a day (BID) | ORAL | 0 refills | Status: AC

## 2021-08-18 NOTE — Progress Notes (Signed)
OFFICE VISIT ? ?08/18/2021 ? ?CC:  ?Chief Complaint  ?Patient presents with  ? Abdominal Pain  ?  Sore and pt also c/o soreness/discomfort in chest  ? Frequent Urination  ?  Started 1 month ago; denies discomfort  ? ? ?Patient is a 56 y.o. male who presents for "stomach hurting and peeing throughout night". ? ?HPI: ?1 month ago he got his significant acute gastroenteritis illness, most prominently nausea and vomiting for 1 to 2 days.  This completely resolved but since then has had some midepigastric nagging pain.  No nausea vomiting or diarrhea anymore.  No fevers.  No chest pain.  No dysphagia. ? ?He also noted onset of urinary frequency and nocturia shortly after his GE illness. ?No dysuria, no urinary urgency, no hematuria, no suprapubic pain or flank pain. ?He is an over the road trucker. ? ?ROS as above, plus--> no fevers, no CP, no SOB, no wheezing, no cough, no dizziness, no HAs, no rashes, no melena/hematochezia.  No polyuria or polydipsia.  No myalgias or arthralgias.  No focal weakness, paresthesias, or tremors.  No acute vision or hearing abnormalities.   No recent changes in lower legs. ? No palpitations.   ? ?Past Medical History:  ?Diagnosis Date  ? Bilateral sensorineural hearing loss   ? + hearing aids  ? Degenerative spondylolisthesis   ? grd II, L5-S1  ? GERD (gastroesophageal reflux disease)   ? Normal Bravo pH probe study 2010  ? IBS (irritable bowel syndrome) 2016  ? w/diarrhea (celiac blood work neg)  ? Persistent asthma with undetermined severity   ? Vasomotor rhinitis   ? ? ?Past Surgical History:  ?Procedure Laterality Date  ? abd ultrasound  03/2012  ? Normal  ? COLONOSCOPY W/ POLYPECTOMY  03/02/15  ? benign lymphoid polyp: recall 10 yrs (Dr. Benson Norway)  ? ESOPHAGOGASTRODUODENOSCOPY  12/2008  ? Mild duodenitis, biopsies neg. (Dr. Benson Norway)  ? ? ?Outpatient Medications Prior to Visit  ?Medication Sig Dispense Refill  ? albuterol (VENTOLIN HFA) 108 (90 Base) MCG/ACT inhaler Inhale 1-2 puffs into the  lungs every 4 (four) hours as needed for wheezing or shortness of breath. 18 g 1  ? azelastine (ASTELIN) 0.1 % nasal spray Place 2 sprays into both nostrils 2 (two) times daily. 30 mL 11  ? fluticasone (FLOVENT HFA) 110 MCG/ACT inhaler INHALE 1 PUFF BY MOUTH TWICE A DAY 1 each 11  ? naproxen sodium (ANAPROX) 220 MG tablet Take 1 tablet by mouth 2 (two) times daily as needed.    ? ?No facility-administered medications prior to visit.  ? ? ?Allergies  ?Allergen Reactions  ? Amoxicillin Other (See Comments)  ?  Dizziness  ? ? ?ROS ?As per HPI ? ?PE: ? ?  08/18/2021  ?  1:10 PM 08/26/2020  ?  8:52 AM 09/25/2019  ?  3:34 PM  ?Vitals with BMI  ?Height 5\' 9"  5\' 9"  5\' 9"   ?Weight 179 lbs 176 lbs 6 oz 173 lbs  ?BMI 26.42 26.04 25.54  ?Systolic [id number]   ?Diastolic 81 74   ?Pulse 85 83   ? ? ? ?Physical Exam ? ?Gen: Alert, well appearing.  Patient is oriented to person, place, time, and situation. ?AFFECT: pleasant, lucid thought and speech. ?[id number]: no injection, icteris, swelling, or exudate.  EOMI, PERRLA. ?Mouth: lips without lesion/swelling.  Oral mucosa pink and moist. Oropharynx without erythema, exudate, or swelling.  ?CV: RRR, no m/r/g.   ?LUNGS: CTA bilat, nonlabored resps, good aeration in all lung fields. ?  ABD: soft, NT, ND ?EXT: no clubbing or cyanosis.  no edema.  ?Rectal exam: negative without mass, lesions or tenderness, PROSTATE EXAM: smooth and symmetric without nodules or tenderness. ? ?LABS:  ?Last CBC ?Lab Results  ?Component Value Date  ? WBC 7.1 10/04/2016  ? HGB 17.0 10/04/2016  ? HCT 50.6 10/04/2016  ? MCV 89.9 10/04/2016  ? RDW 13.2 10/04/2016  ? PLT 262.0 10/04/2016  ? ?Last metabolic panel ?Lab Results  ?Component Value Date  ? GLUCOSE 96 10/04/2016  ? NA 139 10/04/2016  ? K 4.5 10/04/2016  ? CL 101 10/04/2016  ? CO2 32 10/04/2016  ? BUN 14 10/04/2016  ? CREATININE 0.92 10/04/2016  ? CALCIUM 9.5 10/04/2016  ? PROT 7.1 10/04/2016  ? ALBUMIN 4.2 10/04/2016  ? BILITOT 0.5 10/04/2016  ? ALKPHOS 72  10/04/2016  ? AST 18 10/04/2016  ? ALT 20 10/04/2016  ? ?Dipstick UA today is normal. ? ?IMPRESSION AND PLAN: ? ?#1 epigastric pain. ?Sounds like acute esophagitis related to all the vomiting he did 1 month ago. ?Omeprazole 40 mg a day x14 days. ? ?#2 acute prostatitis. ?Favor repetitive trauma more than infection.  UA today normal. ?Prostate exam today benign. ?We will do Bactrim x2 weeks as well as meloxicam 15 mg a day x2 weeks. ? ?An After Visit Summary was printed and given to the patient. ? ?FOLLOW UP: Return if symptoms worsen or fail to improve. ? ?Signed:  Crissie Sickles, MD           08/18/2021 ? ?

## 2021-08-27 ENCOUNTER — Other Ambulatory Visit: Payer: Self-pay | Admitting: Family Medicine

## 2021-09-18 ENCOUNTER — Encounter: Payer: Self-pay | Admitting: Family Medicine

## 2021-09-18 DIAGNOSIS — N419 Inflammatory disease of prostate, unspecified: Secondary | ICD-10-CM

## 2021-09-19 NOTE — Telephone Encounter (Signed)
Okay, referral ordered 

## 2021-09-29 ENCOUNTER — Other Ambulatory Visit: Payer: Self-pay | Admitting: Family Medicine

## 2021-11-25 ENCOUNTER — Telehealth: Payer: Self-pay

## 2021-11-25 ENCOUNTER — Encounter: Payer: Self-pay | Admitting: Family Medicine

## 2021-11-25 NOTE — Telephone Encounter (Signed)
[facility]   Patient Name: Brian Osborne ETT Gender: Male DOB: 03-24-1966 Age: 56 Y 21 D Return Phone Number: [phone] (Primary) Address: City/ State/ Zip: [place] [address] Client [facility] Night - Client Client Site [facility] Night Provider Santiago Bumpers - MD Contact Type Call Who Is Calling Patient / Member / Family / Caregiver Call Type Triage / Clinical Relationship To Patient Self Return Phone Number [phone] (Primary) Chief Complaint Fever (non-urgent symptom) (greater than THREE MONTHS old) Reason for Call Symptomatic / Request for Health Information Initial Comment Caller states he was not feeling well when he woke up this morning. He has a fever of 100.7 and took a Covid test at home. The test has a thin blue line and a faint line so he is concerned. Translation No Nurse Assessment Nurse: Clovis Pu, RN, Rosey Bath Date/Time Lamount Cohen Time): 11/24/2021 6:10:23 PM Confirm and document reason for call. If symptomatic, describe symptoms. ---Caller states that he tested positive for covid today on a home test. Reports that he woke up with body aches and fever. Temp was 100.7. Also has congestion. Temp is currently 99.9(oral). Does the patient have any new or worsening symptoms? ---Yes Will a triage be completed? ---Yes Related visit to physician within the last 2 weeks? ---No Does the PT have any chronic conditions? (i.e. diabetes, asthma, this includes High risk factors for pregnancy, etc.) ---Yes List chronic conditions. ---seasonal allergies, inhaler for asthma Is this a behavioral health or substance abuse call? ---No Guidelines Guideline Title Affirmed Question Affirmed Notes Nurse Date/Time (Eastern Time) COVID-19 - Diagnosed or Suspected [1] HIGH RISK patient (e.g., weak immune system, age > 64 years, obesity with BMI 30 or higher, pregnant, chronic lung disease Clovis Pu,  RN, Rosey Bath 11/24/2021 6:12:34 PM PLEASE NOTE: All timestamps contained within this report are represented as [place] Standard Time. CONFIDENTIALTY NOTICE: This fax transmission is intended only for the addressee. It contains information that is legally privileged, confidential or otherwise protected from use or disclosure. If you are not the intended recipient, you are strictly prohibited from reviewing, disclosing, copying using or disseminating any of this information or taking any action in reliance on or regarding this information. If you have received this fax in error, please notify us immediately by telephone so that we can arrange for its return to [place]. Phone: [phone], Toll-Free: [phone], Fax: [phone] Page: 2 of 2 Call Id: [id number] Guidelines Guideline Title Affirmed Question Affirmed Notes Nurse Date/Time Lamount Cohen Time) or other chronic medical condition) AND [2] COVID symptoms (e.g., cough, fever) (Exceptions: Already seen by PCP and no new or worsening symptoms.) Disp. Time Lamount Cohen Time) Disposition Final User 11/24/2021 6:07:29 PM Send To RN Personal Darrel Reach, Olivia 11/24/2021 6:16:44 PM Call PCP within 24 Hours Yes Clovis Pu, RN, Rosey Bath Final Disposition 11/24/2021 6:16:44 PM Call PCP within 24 Hours Yes Clovis Pu, RN, Suezanne Cheshire Disagree/Comply Comply Caller Understands Yes PreDisposition Did not know what to do Care Advice Given Per Guideline CALL PCP WITHIN 24 HOURS: * You need to discuss this with your doctor (or NP/PA) within the next 24 hours. * IF OFFICE WILL BE OPEN: Call the office when it opens tomorrow morning. * The symptoms are generally treated the same whether you have COVID-19, influenza or some other respiratory virus. GENERAL CARE ADVICE FOR COVID-19 SYMPTOMS: * Cough: Use cough drops. * Fever, Chills, and Sweats: For fever over 101 F (38.3 C), take acetaminophen every  4 to 6 hours (Adults 650 mg) OR ibuprofen every 6 to 8 hours  (Adults 400 mg). Before taking any medicine, read all the instructions on the package. Do not take aspirin unless your doctor has prescribed it for you. Chills can sometimes come before a fever. You may feel cold in your hands and feet. You may have shivering. You may also feel sweaty as your body temperature goes down. * STAY HOME A MINIMUM OF 5 DAYS: People with MILD COVID-19 can STOP HOME ISOLATION AFTER 5 DAYS if (1) fever has been gone for 24 hours (without using fever medicine) AND (2) symptoms are better. Continue to wear a well-fitted mask for a full 10 days when around others. * AVOID TRAVEL: Avoid travel for 10 days after you tested positive for COVID-19. CALL BACK IF: * You become worse CARE ADVICE given per COVID-19 - DIAGNOSED OR SUSPECTED (Adult) guideline.

## 2022-04-11 ENCOUNTER — Ambulatory Visit: Payer: BLUE CROSS/BLUE SHIELD | Admitting: Family Medicine

## 2022-04-11 ENCOUNTER — Encounter: Payer: Self-pay | Admitting: Family Medicine

## 2022-04-11 VITALS — BP 131/81 | HR 77 | Temp 98.1°F | Ht 69.0 in | Wt 168.4 lb

## 2022-04-11 DIAGNOSIS — H6123 Impacted cerumen, bilateral: Secondary | ICD-10-CM

## 2022-04-11 NOTE — Progress Notes (Signed)
OFFICE VISIT  04/11/2022  CC:  Chief Complaint  Patient presents with   Cerumen impaction    Pt states wax in both ears when he removed his hearing aides.     Patient is a 56 y.o. male who presents for wax in ears.  HPI: History of wax buildup in ears. Has sensorineural hearing loss and wears hearing aids.  Has been noting significantly increased wax on the hearing aids lately. He feels like he has had to turn them up more lately.   Past Medical History:  Diagnosis Date   Bilateral sensorineural hearing loss    + hearing aids   Degenerative spondylolisthesis    grd II, L5-S1   GERD (gastroesophageal reflux disease)    Normal Bravo pH probe study 2010   History of COVID-19    11/25/21   IBS (irritable bowel syndrome) 2016   w/diarrhea (celiac blood work neg)   Persistent asthma with undetermined severity    Vasomotor rhinitis     Past Surgical History:  Procedure Laterality Date   abd ultrasound  03/2012   Normal   COLONOSCOPY W/ POLYPECTOMY  03/02/15   benign lymphoid polyp: recall 10 yrs (Dr. Elnoria Howard)   ESOPHAGOGASTRODUODENOSCOPY  12/2008   Mild duodenitis, biopsies neg. (Dr. Elnoria Howard)    Outpatient Medications Prior to Visit  Medication Sig Dispense Refill   albuterol (VENTOLIN HFA) 108 (90 Base) MCG/ACT inhaler Inhale 1-2 puffs into the lungs every 4 (four) hours as needed for wheezing or shortness of breath. 18 g 1   Azelastine HCl 137 MCG/SPRAY SOLN PLACE 2 SPRAYS INTO BOTH NOSTRILS 2 (TWO) TIMES DAILY 30 mL 3   fluticasone (FLOVENT HFA) 110 MCG/ACT inhaler INHALE 1 PUFF BY MOUTH TWICE A DAY 1 each 11   naproxen sodium (ANAPROX) 220 MG tablet Take 1 tablet by mouth 2 (two) times daily as needed.     omeprazole (PRILOSEC) 40 MG capsule Take 1 capsule (40 mg total) by mouth daily for 14 days. 14 capsule 0   No facility-administered medications prior to visit.    Allergies  Allergen Reactions   Amoxicillin Other (See Comments)    Dizziness    ROS As per  HPI  PE:    04/11/2022    8:32 AM 08/18/2021    1:10 PM 08/26/2020    8:52 AM  Vitals with BMI  Height 5\' 9"  5\' 9"  5\' 9"   Weight 168 lbs 6 oz 179 lbs 176 lbs 6 oz  BMI 24.86 26.42 26.04  Systolic 131 115  Diastolic 81 81 74  Pulse 77 85 83     Physical Exam  EARS: bilat 50% cerumen obstruction.  Tms partially visible and appear normal/intact.   LABS:  Last CBC Lab Results  Component Value Date   WBC 7.1 10/04/2016   HGB 17.0 10/04/2016   HCT 50.6 10/04/2016   MCV 89.9 10/04/2016   RDW 13.2 10/04/2016   PLT 262.0 10/04/2016   Last metabolic panel Lab Results  Component Value Date   GLUCOSE 96 10/04/2016   NA 139 10/04/2016   K 4.5 10/04/2016   CL 101 10/04/2016   CO2 32 10/04/2016   BUN 14 10/04/2016   CREATININE 0.92 10/04/2016   CALCIUM 9.5 10/04/2016   PROT 7.1 10/04/2016   ALBUMIN 4.2 10/04/2016   BILITOT 0.5 10/04/2016   ALKPHOS 72 10/04/2016   AST 18 10/04/2016   ALT 20 10/04/2016   IMPRESSION AND PLAN:  Cerumen impaction, bilaterally. Patient is in favor of  irrigation of both sides. Consent obtained. Procedure: Cerumen Disimpaction  Warm water was applied and gentle ear lavage performed on both sides. There were no complications and following the disimpaction the tympanic membrane was visible on both sides and canals were completely clear of cerumen. Tympanic membranes are intact following the procedure.   The patient reported relief of symptoms after removal of cerumen   An After Visit Summary was printed and given to the patient.  FOLLOW UP: Return if symptoms worsen or fail to improve.  Signed:  Crissie Sickles, MD           04/11/2022

## 2022-05-25 ENCOUNTER — Ambulatory Visit: Payer: BLUE CROSS/BLUE SHIELD | Admitting: Family Medicine

## 2022-11-06 ENCOUNTER — Telehealth: Payer: Self-pay | Admitting: Family Medicine

## 2022-11-06 ENCOUNTER — Other Ambulatory Visit: Payer: Self-pay | Admitting: Family Medicine

## 2022-11-06 MED ORDER — ALBUTEROL SULFATE HFA 108 (90 BASE) MCG/ACT IN AERS: 1.0000 | INHALATION_SPRAY | RESPIRATORY_TRACT | 0 refills | Status: DC | PRN

## 2022-11-06 MED ORDER — AZELASTINE HCL 137 MCG/SPRAY NA SOLN: NASAL | 0 refills | Status: DC

## 2022-11-06 MED ORDER — FLUTICASONE PROPIONATE HFA 110 MCG/ACT IN AERO: INHALATION_SPRAY | RESPIRATORY_TRACT | 0 refills | Status: DC

## 2022-11-06 NOTE — Telephone Encounter (Signed)
Patient is requesting refill on 3 medications. The medications are fluticasone (FLOVENT HFA) 110 MCG/ACT inhaler, Azelastine HCl 137 MCG/SPRAY SOLN , albuterol (VENTOLIN HFA) 108 (90 Base) MCG/ACT inhaler Patient last seen in Nov 2023. I advised patient office visit may be needed.

## 2022-11-06 NOTE — Telephone Encounter (Signed)
Will do 30d supply.  Pt do for f/u

## 2022-11-07 MED ORDER — ALBUTEROL SULFATE HFA 108 (90 BASE) MCG/ACT IN AERS: 1.0000 | INHALATION_SPRAY | RESPIRATORY_TRACT | 0 refills | Status: DC | PRN

## 2022-11-07 MED ORDER — FLUTICASONE PROPIONATE HFA 110 MCG/ACT IN AERO: INHALATION_SPRAY | RESPIRATORY_TRACT | 0 refills | Status: DC

## 2022-11-07 MED ORDER — AZELASTINE HCL 137 MCG/SPRAY NA SOLN: NASAL | 0 refills | Status: DC

## 2022-11-07 NOTE — Telephone Encounter (Signed)
Pt advised 30 day supply sent for requested medications.

## 2022-11-07 NOTE — Telephone Encounter (Signed)
Patient scheduled.

## 2022-11-14 NOTE — Patient Instructions (Signed)

## 2022-11-15 ENCOUNTER — Encounter: Payer: Self-pay | Admitting: Family Medicine

## 2022-11-15 ENCOUNTER — Ambulatory Visit (INDEPENDENT_AMBULATORY_CARE_PROVIDER_SITE_OTHER): Payer: BLUE CROSS/BLUE SHIELD | Admitting: Family Medicine

## 2022-11-15 VITALS — BP 130/80 | HR 80 | Ht 69.0 in | Wt 174.0 lb

## 2022-11-15 DIAGNOSIS — Z0000 Encounter for general adult medical examination without abnormal findings: Secondary | ICD-10-CM | POA: Diagnosis not present

## 2022-11-15 DIAGNOSIS — Z125 Encounter for screening for malignant neoplasm of prostate: Secondary | ICD-10-CM

## 2022-11-15 MED ORDER — ALBUTEROL SULFATE HFA 108 (90 BASE) MCG/ACT IN AERS: 1.0000 | INHALATION_SPRAY | RESPIRATORY_TRACT | 1 refills | Status: AC | PRN

## 2022-11-15 MED ORDER — FLUTICASONE PROPIONATE HFA 110 MCG/ACT IN AERO: INHALATION_SPRAY | RESPIRATORY_TRACT | 3 refills | Status: AC

## 2022-11-15 MED ORDER — AZELASTINE HCL 137 MCG/SPRAY NA SOLN: NASAL | 3 refills | Status: DC

## 2022-11-15 NOTE — Progress Notes (Signed)
Office Note 11/15/2022  CC:  Chief Complaint  Patient presents with   Annual Exam    Fasting CPE.    HPI:  Patient is a 57 y.o. male who is here for annual health maintenance exam. Brian Osborne is feeling well. 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 Walks for exercise. Rarely takes albuterol.  Takes fluticasone only intermittently when he feels some asthmatic symptoms coming on.  Past Medical History:  Diagnosis Date   Bilateral sensorineural hearing loss    + hearing aids   Degenerative spondylolisthesis    grd II, L5-S1   GERD (gastroesophageal reflux disease)    Normal Bravo pH probe study 2010   History of COVID-19    11/25/21   IBS (irritable bowel syndrome) 2016   w/diarrhea (celiac blood work neg)   Persistent asthma with undetermined severity    Vasomotor rhinitis     Past Surgical History:  Procedure Laterality Date   abd ultrasound  03/2012   Normal   COLONOSCOPY W/ POLYPECTOMY  03/02/15   benign lymphoid polyp: recall 10 yrs (Dr. Elnoria Howard)   ESOPHAGOGASTRODUODENOSCOPY  12/2008   Mild duodenitis, biopsies neg. (Dr. Elnoria Howard)    History reviewed. No pertinent family history.  Social History   Socioeconomic History   Marital status: Divorced    Spouse name: Not on file   Number of children: Not on file   Years of education: Not on file   Highest education level: Not on file  Occupational History   Not on file  Tobacco Use   Smoking status: Former   Smokeless tobacco: Never  Substance and Sexual Activity   Alcohol use: No    Alcohol/week: 0.0 standard drinks of alcohol   Drug use: No   Sexual activity: Not on file  Other Topics Concern   Not on file  Social History Narrative   Married, 3 children all adults from, from first marriage.   Adopted, so FH unknown.   Educ: HS   Occup: Engineer, drilling   No signif cigarette use, but cigars up until 2011.   No alcohol  or drugs.   No exercise.   Social Determinants of Health   Financial Resource Strain: Not on file  Food Insecurity: Not on file  Transportation Needs: Not on file  Physical Activity: Not on file  Stress: Not on file  Social Connections: Not on file  Intimate Partner Violence: Not on file    Outpatient Medications Prior to Visit  Medication Sig Dispense Refill   albuterol (VENTOLIN HFA) 108 (90 Base) MCG/ACT inhaler Inhale 1-2 puffs into the lungs every 4 (four) hours as needed for wheezing or shortness of breath. 6.7 g 0   Azelastine HCl 137 MCG/SPRAY SOLN PLACE 2 SPRAYS INTO BOTH NOSTRILS 2 (TWO) TIMES DAILY 30 mL 0   fluticasone (FLOVENT HFA) 110 MCG/ACT inhaler INHALE 1 PUFF BY MOUTH TWICE A DAY 1 each 0   naproxen sodium (ANAPROX) 220 MG tablet Take 1 tablet by mouth 2 (two) times daily as needed.     omeprazole (PRILOSEC) 40 MG capsule Take 1 capsule (40 mg total) by mouth daily for 14 days. 14 capsule 0   No facility-administered medications prior to visit.    Allergies  Allergen Reactions   Amoxicillin Other (See Comments)    Dizziness    Review of Systems  Constitutional:  Negative for appetite change, chills, fatigue and fever.  HENT:  Negative  for congestion, dental problem, ear pain and sore throat.   Eyes:  Negative for discharge, redness and visual disturbance.  Respiratory:  Negative for cough, chest tightness, shortness of breath and wheezing.   Cardiovascular:  Negative for chest pain, palpitations and leg swelling.  Gastrointestinal:  Negative for abdominal pain, blood in stool, diarrhea, nausea and vomiting.  Genitourinary:  Negative for difficulty urinating, dysuria, flank pain, frequency, hematuria and urgency.  Musculoskeletal:  Negative for arthralgias, back pain, joint swelling, myalgias and neck stiffness.  Skin:  Negative for pallor and rash.  Neurological:  Negative for dizziness, speech difficulty, weakness and headaches.  Hematological:  Negative  for adenopathy. Does not bruise/bleed easily.  Psychiatric/Behavioral:  Negative for confusion and sleep disturbance. The patient is not nervous/anxious.     PE;    11/15/2022    2:30 PM 04/11/2022    8:32 AM 08/18/2021    1:10 PM  Vitals with BMI  Height 5\' 9"  5\' 9"  5\' 9"   Weight 174 lbs 168 lbs 6 oz 179 lbs  BMI 25.68 24.86 26.42  Systolic 139 131 951  Diastolic 91 81 81  Pulse 80 77 85   Gen: Alert, well appearing.  Patient is oriented to person, place, time, and situation. AFFECT: pleasant, lucid thought and speech. ENT: Ears: EACs clear, normal epithelium.  TMs with good light reflex and landmarks bilaterally.  Eyes: no injection, icteris, swelling, or exudate.  EOMI, PERRLA. Nose: no drainage or turbinate edema/swelling.  No injection or focal lesion.  Mouth: lips without lesion/swelling.  Oral mucosa pink and moist.  Dentition intact and without obvious caries or gingival swelling.  Oropharynx without erythema, exudate, or swelling.  Neck: supple/nontender.  No LAD, mass, or TM.  Carotid pulses 2+ bilaterally, without bruits. CV: RRR, no m/r/g.   LUNGS: CTA bilat, nonlabored resps, good aeration in all lung fields. ABD: soft, NT, ND, BS normal.  No hepatospenomegaly or mass.  No bruits. EXT: no clubbing, cyanosis, or edema.  Musculoskeletal: no joint swelling, erythema, warmth, or tenderness.  ROM of all joints intact. Skin - no sores or suspicious lesions or rashes or color changes  Pertinent labs:  Lab Results  Component Value Date   TSH 1.38 10/04/2016   Lab Results  Component Value Date   WBC 7.1 10/04/2016   HGB 17.0 10/04/2016   HCT 50.6 10/04/2016   MCV 89.9 10/04/2016   PLT 262.0 10/04/2016   Lab Results  Component Value Date   CREATININE 0.92 10/04/2016   BUN 14 10/04/2016   NA 139 10/04/2016   K 4.5 10/04/2016   CL 101 10/04/2016   CO2 32 10/04/2016   Lab Results  Component Value Date   ALT 20 10/04/2016   AST 18 10/04/2016   ALKPHOS 72  10/04/2016   BILITOT 0.5 10/04/2016   Lab Results  Component Value Date   CHOL 209 (H) 10/04/2016   Lab Results  Component Value Date   HDL 36.10 (L) 10/04/2016   Lab Results  Component Value Date   LDLCALC 138 (H) 10/04/2016   Lab Results  Component Value Date   TRIG 174.0 (H) 10/04/2016   Lab Results  Component Value Date   CHOLHDL 6 10/04/2016   Lab Results  Component Value Date   PSA 0.77 10/04/2016   ASSESSMENT AND PLAN:   #1 health maintenance exam: Reviewed age and gender appropriate health maintenance issues (prudent diet, regular exercise, health risks of tobacco and excessive alcohol, use of seatbelts, fire alarms in  home, use of sunscreen).  Also reviewed age and gender appropriate health screening as well as vaccine recommendations. Vaccines: Tdap -->declined. Shingrix-->declined. Labs: Health panel, PSA Prostate ca screening:  PSA today. Colon cancer screening: next colonoscopy due 2026.  #2 mild persistent asthma. Continue Flovent 110 mcg, 1 puff twice daily pulse method. Albuterol every 6 hours as needed. Refills today. Refilled Astelin which he takes daily for his allergic rhinitis.  An After Visit Summary was printed and given to the patient.  FOLLOW UP:  Return in about 1 year (around 11/15/2023) for annual CPE (fasting).  Signed:  Santiago Bumpers, MD           11/15/2022

## 2022-11-16 LAB — CBC
HCT: 47.7 % (ref 39.0–52.0)
Hemoglobin: 16 g/dL (ref 13.0–17.0)
MCHC: 33.6 g/dL (ref 30.0–36.0)
MCV: 91 fl (ref 78.0–100.0)
Platelets: 233 10*3/uL (ref 150.0–400.0)
RBC: 5.25 Mil/uL (ref 4.22–5.81)
RDW: 13.4 % (ref 11.5–15.5)
WBC: 8.7 10*3/uL (ref 4.0–10.5)

## 2022-11-16 LAB — COMPREHENSIVE METABOLIC PANEL
ALT: 27 U/L (ref 0–53)
AST: 25 U/L (ref 0–37)
Albumin: 4.3 g/dL (ref 3.5–5.2)
Alkaline Phosphatase: 72 U/L (ref 39–117)
BUN: 15 mg/dL (ref 6–23)
CO2: 26 mEq/L (ref 19–32)
Calcium: 9.7 mg/dL (ref 8.4–10.5)
Chloride: 105 mEq/L (ref 96–112)
Creatinine, Ser: 0.87 mg/dL (ref 0.40–1.50)
GFR: 95.43 mL/min (ref >60.00)
Glucose, Bld: 91 mg/dL (ref 70–99)
Potassium: 4.1 mEq/L (ref 3.5–5.1)
Sodium: 140 mEq/L (ref 135–145)
Total Bilirubin: 0.8 mg/dL (ref 0.2–1.2)
Total Protein: 6.9 g/dL (ref 6.0–8.3)

## 2022-11-16 LAB — LIPID PANEL
Cholesterol: 212 mg/dL — ABNORMAL HIGH (ref 0–200)
HDL: 38.1 mg/dL — ABNORMAL LOW (ref >39.00)
LDL Cholesterol: 142 mg/dL — ABNORMAL HIGH (ref 0–99)
NonHDL: 173.84
Total CHOL/HDL Ratio: 6
Triglycerides: 159 mg/dL — ABNORMAL HIGH (ref 0.0–149.0)
VLDL: 31.8 mg/dL (ref 0.0–40.0)

## 2022-11-16 LAB — PSA: PSA: 1.01 ng/mL (ref 0.10–4.00)

## 2022-11-16 LAB — TSH: TSH: 2.09 u[IU]/mL (ref 0.35–5.50)

## 2022-11-25 ENCOUNTER — Encounter: Payer: Self-pay | Admitting: Family Medicine

## 2022-12-03 ENCOUNTER — Encounter: Payer: Self-pay | Admitting: Family Medicine

## 2022-12-04 ENCOUNTER — Other Ambulatory Visit: Payer: Self-pay

## 2022-12-04 DIAGNOSIS — E782 Mixed hyperlipidemia: Secondary | ICD-10-CM

## 2022-12-04 MED ORDER — ATORVASTATIN CALCIUM 20 MG PO TABS: 20.0000 mg | ORAL_TABLET | Freq: Every day | ORAL | 2 refills | Status: DC

## 2023-03-04 ENCOUNTER — Other Ambulatory Visit: Payer: Self-pay | Admitting: Family Medicine

## 2023-11-18 ENCOUNTER — Other Ambulatory Visit: Payer: Self-pay | Admitting: Family Medicine

## 2023-12-14 ENCOUNTER — Other Ambulatory Visit: Payer: Self-pay | Admitting: Family Medicine

## 2024-03-21 ENCOUNTER — Other Ambulatory Visit: Payer: Self-pay | Admitting: Family Medicine

## 2024-05-09 ENCOUNTER — Ambulatory Visit: Admitting: Family Medicine

## 2024-05-09 VITALS — BP 154/89 | HR 69 | Temp 97.8°F | Ht 69.0 in | Wt 182.4 lb

## 2024-05-09 DIAGNOSIS — J069 Acute upper respiratory infection, unspecified: Secondary | ICD-10-CM | POA: Diagnosis not present

## 2024-05-09 DIAGNOSIS — J4521 Mild intermittent asthma with (acute) exacerbation: Secondary | ICD-10-CM

## 2024-05-09 MED ORDER — BENZONATATE 200 MG PO CAPS: 200.0000 mg | ORAL_CAPSULE | Freq: Two times a day (BID) | ORAL | 0 refills | Status: AC | PRN

## 2024-05-09 MED ORDER — PREDNISONE 20 MG PO TABS: ORAL_TABLET | ORAL | 0 refills | Status: AC

## 2024-05-09 NOTE — Progress Notes (Signed)
 OFFICE VISIT  05/09/2024  CC:  Chief Complaint  Patient presents with   Cough    Persistent; prescribed Z-pack and Tessalon Pearls, still has cough with difficulty. Used Dayquil. Symptoms started last Thurs with sore throat. Denies fatigue, HA, n/v/d, body aches or chills    Patient is a 58 y.o. male who presents accompanied by his wife  for cough.  HPI: 8 days ago he woke up with nasal congestion, postnasal drip, and sore throat.  He also had a cough at that time.  A few days after the onset of the illness [facility] and was [facility] a [facility].  The upper respiratory symptoms have resolved but his cough is lingering.  Feels deep in his chest and he hears a little bit of rattling but nothing comes up.  He hears some wheezing. Does not feel short of breath.  No dizziness, fevers, or bodyaches.  He is eating and drinking well. He has an inhaler which just expired a few months ago.  He has been using 2 puffs twice a day in the last week and says it helps minimally.  + hx of asthma.  Past Medical History:  Diagnosis Date   Bilateral sensorineural hearing loss    + hearing aids   Degenerative spondylolisthesis    grd II, L5-S1   GERD (gastroesophageal reflux disease)    Normal Bravo pH probe study 2010   History of COVID-19    11/25/21   Hyperlipemia, mixed    cv risk 8.5%-->rec'd statin 11/2022   IBS (irritable bowel syndrome) 2016   w/diarrhea (celiac blood work neg)   Persistent asthma with undetermined severity    Vasomotor rhinitis     Past Surgical History:  Procedure Laterality Date   abd ultrasound  03/2012   Normal   COLONOSCOPY W/ POLYPECTOMY  03/02/15   benign lymphoid polyp: recall 10 yrs (Dr. Rollin)   ESOPHAGOGASTRODUODENOSCOPY  12/2008   Mild duodenitis, biopsies neg. (Dr. Rollin)    Outpatient Medications Prior to Visit  Medication Sig Dispense Refill   albuterol  (VENTOLIN  HFA) 108 (90 Base) MCG/ACT inhaler Inhale 1-2 puffs into the lungs every 4  (four) hours as needed for wheezing or shortness of breath. 6.7 g 1   Azelastine  HCl 137 MCG/SPRAY SOLN PLACE 2 SPRAYS INTO BOTH NOSTRILS 2 (TWO) TIMES DAILY 30 mL 2   azithromycin (ZITHROMAX) 250 MG tablet Take by mouth.     fluticasone  (FLOVENT  HFA) 110 MCG/ACT inhaler INHALE 1 PUFF BY MOUTH TWICE A DAY 3 each 3   atorvastatin  (LIPITOR) 20 MG tablet TAKE 1 TABLET BY MOUTH EVERY DAY (Patient not taking: Reported on 05/09/2024) 90 tablet 1   No facility-administered medications prior to visit.    Allergies[1]  Review of Systems  As per HPI  PE:    05/09/2024   11:20 AM 05/09/2024   11:15 AM 11/15/2022    2:56 PM  Vitals with BMI  Height  5' 9   Weight  182 lbs 6 oz   BMI  26.92   Systolic 154 145 869  Diastolic 89 93 80  Pulse  69      Physical Exam  Gen: Alert, well appearing.  Patient is oriented to person, place, time, and situation. AFFECT: pleasant, lucid thought and speech. ENT: EACs clear, Tms normal. Eyes: no injection, icteris, swelling, or exudate.  EOMI, PERRLA. Mouth: lips without lesion/swelling.  Oral mucosa pink and moist. Oropharynx without erythema, exudate, or swelling.  Neck: no  LAD Cardiovascular: Regular rhythm and rate without murmur. Lungs: Faint inspiratory rhonchi in the anterior lung fields bilaterally.  Otherwise he is clear to auscultation when he does a normal respiration.  With a forced exhalation maneuver he has significant coarse wheezing and coughing. Breathing is nonlabored.  LABS:  Last CBC Lab Results  Component Value Date   WBC 8.7 11/15/2022   HGB 16.0 11/15/2022   HCT 47.7 11/15/2022   MCV 91.0 11/15/2022   RDW 13.4 11/15/2022   PLT 233.0 11/15/2022   Last metabolic panel Lab Results  Component Value Date   GLUCOSE 91 11/15/2022   NA 140 11/15/2022   K 4.1 11/15/2022   CL 105 11/15/2022   CO2 26 11/15/2022   BUN 15 11/15/2022   CREATININE 0.87 11/15/2022   GFR 95.43 11/15/2022   CALCIUM  9.7 11/15/2022   PROT 6.9  11/15/2022   ALBUMIN 4.3 11/15/2022   BILITOT 0.8 11/15/2022   ALKPHOS 72 11/15/2022   AST 25 11/15/2022   ALT 27 11/15/2022   IMPRESSION AND PLAN:  Viral URI with acute asthma exacerbation. Prednisone  40 mg a day x 5 days. He we will start a new albuterol  inhaler 2 puffs every 6 hours as needed since his current inhaler is expired. Tessalon Perles 200 mg, 1 twice daily as needed, #20, no refill.  An After Visit Summary was printed and given to the patient.  FOLLOW UP: Return if symptoms worsen or fail to improve.  Signed:  Gerlene Hockey, MD           05/09/2024     [1]  Allergies Allergen Reactions   Amoxicillin Other (See Comments)    Dizziness

## 2024-06-27 ENCOUNTER — Ambulatory Visit: Admitting: Family Medicine
